# Patient Record
Sex: Female | Born: 1945
Health system: Southern US, Community
[De-identification: ages and names within clinical notes are randomized; demographics above are authoritative.]

## PROBLEM LIST (undated history)

## (undated) DIAGNOSIS — N201 Calculus of ureter: Secondary | ICD-10-CM

## (undated) DIAGNOSIS — N200 Calculus of kidney: Secondary | ICD-10-CM

## (undated) DIAGNOSIS — R350 Frequency of micturition: Secondary | ICD-10-CM

## (undated) DIAGNOSIS — Z87442 Personal history of urinary calculi: Secondary | ICD-10-CM

## (undated) DIAGNOSIS — D509 Iron deficiency anemia, unspecified: Secondary | ICD-10-CM

## (undated) DIAGNOSIS — M1990 Unspecified osteoarthritis, unspecified site: Secondary | ICD-10-CM

## (undated) DIAGNOSIS — F329 Major depressive disorder, single episode, unspecified: Secondary | ICD-10-CM

## (undated) DIAGNOSIS — K449 Diaphragmatic hernia without obstruction or gangrene: Secondary | ICD-10-CM

## (undated) DIAGNOSIS — E785 Hyperlipidemia, unspecified: Secondary | ICD-10-CM

## (undated) DIAGNOSIS — R3915 Urgency of urination: Secondary | ICD-10-CM

## (undated) DIAGNOSIS — L659 Nonscarring hair loss, unspecified: Secondary | ICD-10-CM

## (undated) DIAGNOSIS — R319 Hematuria, unspecified: Secondary | ICD-10-CM

## (undated) HISTORY — PX: CATARACT EXTRACTION W/ INTRAOCULAR LENS  IMPLANT, BILATERAL: SHX1307

## (undated) HISTORY — PX: INNER EAR SURGERY: SHX679

## (undated) HISTORY — PX: OTHER SURGICAL HISTORY: SHX169

---

## 1998-04-30 ENCOUNTER — Ambulatory Visit (HOSPITAL_COMMUNITY): Admission: RE | Admit: 1998-04-30 | Discharge: 1998-04-30 | Payer: Self-pay

## 1998-05-05 ENCOUNTER — Ambulatory Visit (HOSPITAL_COMMUNITY): Admission: RE | Admit: 1998-05-05 | Discharge: 1998-05-05 | Payer: Self-pay | Admitting: *Deleted

## 1998-11-21 ENCOUNTER — Other Ambulatory Visit: Admission: RE | Admit: 1998-11-21 | Discharge: 1998-11-21 | Payer: Self-pay | Admitting: *Deleted

## 1999-05-26 ENCOUNTER — Ambulatory Visit (HOSPITAL_COMMUNITY): Admission: RE | Admit: 1999-05-26 | Discharge: 1999-05-26 | Payer: Self-pay | Admitting: *Deleted

## 1999-06-12 ENCOUNTER — Emergency Department (HOSPITAL_COMMUNITY): Admission: EM | Admit: 1999-06-12 | Discharge: 1999-06-12 | Payer: Self-pay | Admitting: Emergency Medicine

## 1999-11-05 ENCOUNTER — Emergency Department (HOSPITAL_COMMUNITY): Admission: EM | Admit: 1999-11-05 | Discharge: 1999-11-05 | Payer: Self-pay | Admitting: Emergency Medicine

## 1999-12-11 ENCOUNTER — Other Ambulatory Visit: Admission: RE | Admit: 1999-12-11 | Discharge: 1999-12-11 | Payer: Self-pay | Admitting: *Deleted

## 2000-08-10 ENCOUNTER — Ambulatory Visit (HOSPITAL_COMMUNITY): Admission: RE | Admit: 2000-08-10 | Discharge: 2000-08-10 | Payer: Self-pay | Admitting: *Deleted

## 2000-12-19 ENCOUNTER — Other Ambulatory Visit: Admission: RE | Admit: 2000-12-19 | Discharge: 2000-12-19 | Payer: Self-pay | Admitting: *Deleted

## 2002-01-29 ENCOUNTER — Other Ambulatory Visit: Admission: RE | Admit: 2002-01-29 | Discharge: 2002-01-29 | Payer: Self-pay | Admitting: *Deleted

## 2003-03-14 ENCOUNTER — Other Ambulatory Visit: Admission: RE | Admit: 2003-03-14 | Discharge: 2003-03-14 | Payer: Self-pay | Admitting: *Deleted

## 2004-12-17 ENCOUNTER — Encounter: Admission: RE | Admit: 2004-12-17 | Discharge: 2004-12-17 | Payer: Self-pay | Admitting: Internal Medicine

## 2005-04-15 ENCOUNTER — Encounter: Admission: RE | Admit: 2005-04-15 | Discharge: 2005-07-14 | Payer: Self-pay | Admitting: Psychiatry

## 2006-08-25 ENCOUNTER — Encounter: Admission: RE | Admit: 2006-08-25 | Discharge: 2006-08-25 | Payer: Self-pay | Admitting: Family Medicine

## 2008-02-23 HISTORY — PX: WRIST SURGERY: SHX841

## 2009-07-09 ENCOUNTER — Other Ambulatory Visit: Admission: RE | Admit: 2009-07-09 | Discharge: 2009-07-09 | Payer: Self-pay | Admitting: Family Medicine

## 2010-02-22 HISTORY — PX: KNEE ARTHROSCOPY: SUR90

## 2010-03-15 ENCOUNTER — Encounter: Payer: Self-pay | Admitting: Physical Medicine and Rehabilitation

## 2010-04-30 ENCOUNTER — Emergency Department (HOSPITAL_COMMUNITY)
Admission: EM | Admit: 2010-04-30 | Discharge: 2010-04-30 | Discharge status: Against Medical Advice | Payer: Medicare Other | Attending: Emergency Medicine | Admitting: Emergency Medicine

## 2010-04-30 DIAGNOSIS — Z0389 Encounter for observation for other suspected diseases and conditions ruled out: Secondary | ICD-10-CM | POA: Insufficient documentation

## 2010-04-30 LAB — BASIC METABOLIC PANEL
CO2: 25 mEq/L (ref 19–32)
Calcium: 10.9 mg/dL — ABNORMAL HIGH (ref 8.4–10.5)
Chloride: 104 mEq/L (ref 96–112)
Creatinine, Ser: 0.87 mg/dL (ref 0.4–1.2)
GFR calc Af Amer: >60 mL/min (ref >60)
Sodium: 138 mEq/L (ref 135–145)

## 2010-04-30 LAB — URINALYSIS, ROUTINE W REFLEX MICROSCOPIC
Glucose, UA: NEGATIVE mg/dL
Leukocytes, UA: NEGATIVE
Protein, ur: NEGATIVE mg/dL
Specific Gravity, Urine: 1.017 (ref 1.005–1.030)
Urobilinogen, UA: 0.2 mg/dL (ref 0.0–1.0)

## 2010-04-30 LAB — DIFFERENTIAL
Basophils Absolute: 0 10*3/uL (ref 0.0–0.1)
Basophils Relative: 0 % (ref 0–1)
Eosinophils Absolute: 0.2 10*3/uL (ref 0.0–0.7)
Eosinophils Relative: 2 % (ref 0–5)
Lymphocytes Relative: 24 % (ref 12–46)
Lymphs Abs: 3.2 10*3/uL (ref 0.7–4.0)
Monocytes Absolute: 1 10*3/uL (ref 0.1–1.0)
Monocytes Relative: 7 % (ref 3–12)
Neutro Abs: 8.7 10*3/uL — ABNORMAL HIGH (ref 1.7–7.7)
Neutrophils Relative %: 66 % (ref 43–77)

## 2010-04-30 LAB — URINE MICROSCOPIC-ADD ON

## 2010-04-30 LAB — CBC
Hemoglobin: 12.3 g/dL (ref 12.0–15.0)
Platelets: 274 10*3/uL (ref 150–400)
RBC: 4.08 MIL/uL (ref 3.87–5.11)

## 2012-08-20 ENCOUNTER — Observation Stay (HOSPITAL_COMMUNITY): Payer: BC Managed Care – PPO | Admitting: *Deleted

## 2012-08-20 ENCOUNTER — Emergency Department (HOSPITAL_COMMUNITY): Payer: BC Managed Care – PPO

## 2012-08-20 ENCOUNTER — Encounter (HOSPITAL_COMMUNITY): Payer: Self-pay

## 2012-08-20 ENCOUNTER — Encounter (HOSPITAL_COMMUNITY): Payer: Self-pay | Admitting: *Deleted

## 2012-08-20 ENCOUNTER — Observation Stay (HOSPITAL_COMMUNITY)
Admission: EM | Admit: 2012-08-20 | Discharge: 2012-08-21 | Disposition: A | Discharge status: Home / Self Care | Payer: BC Managed Care – PPO | Attending: Urology | Admitting: Urology

## 2012-08-20 ENCOUNTER — Encounter (HOSPITAL_COMMUNITY)
Admission: EM | Disposition: A | Discharge status: Home / Self Care | Payer: Self-pay | Source: Home / Self Care | Attending: Emergency Medicine

## 2012-08-20 ENCOUNTER — Inpatient Hospital Stay: Admit: 2012-08-20 | Payer: Self-pay | Admitting: Urology

## 2012-08-20 DIAGNOSIS — N390 Urinary tract infection, site not specified: Secondary | ICD-10-CM

## 2012-08-20 DIAGNOSIS — E785 Hyperlipidemia, unspecified: Secondary | ICD-10-CM | POA: Insufficient documentation

## 2012-08-20 DIAGNOSIS — N201 Calculus of ureter: Principal | ICD-10-CM | POA: Insufficient documentation

## 2012-08-20 DIAGNOSIS — R82998 Other abnormal findings in urine: Secondary | ICD-10-CM | POA: Insufficient documentation

## 2012-08-20 DIAGNOSIS — N1330 Unspecified hydronephrosis: Secondary | ICD-10-CM | POA: Insufficient documentation

## 2012-08-20 DIAGNOSIS — Z79899 Other long term (current) drug therapy: Secondary | ICD-10-CM | POA: Insufficient documentation

## 2012-08-20 HISTORY — DX: Hyperlipidemia, unspecified: E78.5

## 2012-08-20 HISTORY — PX: CYSTOSCOPY WITH STENT PLACEMENT: SHX5790

## 2012-08-20 LAB — URINE MICROSCOPIC-ADD ON

## 2012-08-20 LAB — BASIC METABOLIC PANEL
CO2: 25 mEq/L (ref 19–32)
Calcium: 9.1 mg/dL (ref 8.4–10.5)
Chloride: 105 mEq/L (ref 96–112)
Creatinine, Ser: 0.83 mg/dL (ref 0.50–1.10)
Glucose, Bld: 121 mg/dL — ABNORMAL HIGH (ref 70–99)

## 2012-08-20 LAB — CBC
HCT: 32.6 % — ABNORMAL LOW (ref 36.0–46.0)
Hemoglobin: 10.5 g/dL — ABNORMAL LOW (ref 12.0–15.0)
MCH: 28.2 pg (ref 26.0–34.0)
MCV: 87.6 fL (ref 78.0–100.0)
Platelets: 257 10*3/uL (ref 150–400)
RBC: 3.72 MIL/uL — ABNORMAL LOW (ref 3.87–5.11)
WBC: 11.5 10*3/uL — ABNORMAL HIGH (ref 4.0–10.5)

## 2012-08-20 LAB — URINALYSIS, ROUTINE W REFLEX MICROSCOPIC
Bilirubin Urine: NEGATIVE
Glucose, UA: NEGATIVE mg/dL
Hgb urine dipstick: NEGATIVE
Specific Gravity, Urine: 1.019 (ref 1.005–1.030)
Urobilinogen, UA: 0.2 mg/dL (ref 0.0–1.0)

## 2012-08-20 SURGERY — CYSTOSCOPY, WITH STENT INSERTION
Anesthesia: General | Site: Ureter | Laterality: Right | Wound class: Clean Contaminated

## 2012-08-20 MED ORDER — ACETAMINOPHEN 325 MG PO TABS: 650.0000 mg | ORAL_TABLET | ORAL | Status: DC | PRN

## 2012-08-20 MED ORDER — FAMOTIDINE 20 MG PO TABS
20.0000 mg | ORAL_TABLET | Freq: Two times a day (BID) | ORAL | Status: DC | PRN
  Filled 2012-08-20: qty 1

## 2012-08-20 MED ORDER — PHENYLEPHRINE HCL 10 MG/ML IJ SOLN
INTRAMUSCULAR | Status: DC | PRN
  Administered 2012-08-20: 120 ug via INTRAVENOUS
  Administered 2012-08-20: 40 ug via INTRAVENOUS

## 2012-08-20 MED ORDER — METOCLOPRAMIDE HCL 5 MG/ML IJ SOLN
INTRAMUSCULAR | Status: DC | PRN
  Administered 2012-08-20: 10 mg via INTRAVENOUS

## 2012-08-20 MED ORDER — KETAMINE HCL 10 MG/ML IJ SOLN
INTRAMUSCULAR | Status: DC | PRN
  Administered 2012-08-20: 10 mg via INTRAVENOUS

## 2012-08-20 MED ORDER — PROPOFOL 10 MG/ML IV BOLUS
INTRAVENOUS | Status: DC | PRN
  Administered 2012-08-20: 80 mg via INTRAVENOUS

## 2012-08-20 MED ORDER — DEXTROSE 5 % IV SOLN
1.0000 g | Freq: Once | INTRAVENOUS | Status: AC
  Administered 2012-08-20: 1 g via INTRAVENOUS
  Filled 2012-08-20: qty 10

## 2012-08-20 MED ORDER — LACTATED RINGERS IV SOLN: INTRAVENOUS | Status: DC

## 2012-08-20 MED ORDER — OXYCODONE-ACETAMINOPHEN 5-325 MG PO TABS
1.0000 | ORAL_TABLET | Freq: Once | ORAL | Status: AC
  Administered 2012-08-20: 1 via ORAL
  Filled 2012-08-20: qty 1

## 2012-08-20 MED ORDER — SULFAMETHOXAZOLE-TRIMETHOPRIM 400-80 MG PO TABS: 1.0000 | ORAL_TABLET | Freq: Two times a day (BID) | ORAL | Status: DC

## 2012-08-20 MED ORDER — ENOXAPARIN SODIUM 40 MG/0.4ML ~~LOC~~ SOLN
40.0000 mg | SUBCUTANEOUS | Status: DC
  Administered 2012-08-20: 40 mg via SUBCUTANEOUS
  Filled 2012-08-20 (×2): qty 0.4

## 2012-08-20 MED ORDER — EPHEDRINE SULFATE 50 MG/ML IJ SOLN
INTRAMUSCULAR | Status: DC | PRN
  Administered 2012-08-20: 10 mg via INTRAVENOUS

## 2012-08-20 MED ORDER — ONDANSETRON HCL 4 MG/2ML IJ SOLN: 4.0000 mg | INTRAMUSCULAR | Status: DC | PRN

## 2012-08-20 MED ORDER — HYDROMORPHONE HCL PF 1 MG/ML IJ SOLN
0.5000 mg | INTRAMUSCULAR | Status: DC | PRN
  Administered 2012-08-20: 0.5 mg via INTRAVENOUS
  Filled 2012-08-20: qty 1

## 2012-08-20 MED ORDER — SODIUM CHLORIDE 0.9 % IR SOLN
Status: DC | PRN
  Administered 2012-08-20: 3000 mL

## 2012-08-20 MED ORDER — MIDAZOLAM HCL 5 MG/5ML IJ SOLN
INTRAMUSCULAR | Status: DC | PRN
  Administered 2012-08-20: 2 mg via INTRAVENOUS

## 2012-08-20 MED ORDER — OXYBUTYNIN CHLORIDE 5 MG PO TABS: 5.0000 mg | ORAL_TABLET | Freq: Three times a day (TID) | ORAL | Status: DC

## 2012-08-20 MED ORDER — HYDROMORPHONE HCL PF 1 MG/ML IJ SOLN: 0.2500 mg | INTRAMUSCULAR | Status: DC | PRN

## 2012-08-20 MED ORDER — DEXAMETHASONE SODIUM PHOSPHATE 4 MG/ML IJ SOLN
INTRAMUSCULAR | Status: DC | PRN
  Administered 2012-08-20: 10 mg via INTRAVENOUS

## 2012-08-20 MED ORDER — PROMETHAZINE HCL 25 MG/ML IJ SOLN
6.2500 mg | INTRAMUSCULAR | Status: DC | PRN
  Filled 2012-08-20: qty 1

## 2012-08-20 MED ORDER — MORPHINE SULFATE 4 MG/ML IJ SOLN
6.0000 mg | Freq: Once | INTRAMUSCULAR | Status: AC
  Administered 2012-08-20: 5 mg via INTRAVENOUS
  Filled 2012-08-20: qty 2

## 2012-08-20 MED ORDER — HYDROCODONE-ACETAMINOPHEN 5-325 MG PO TABS
1.0000 | ORAL_TABLET | ORAL | Status: DC | PRN
  Administered 2012-08-20: 2 via ORAL
  Filled 2012-08-20: qty 2

## 2012-08-20 MED ORDER — SULFAMETHOXAZOLE-TMP DS 800-160 MG PO TABS
1.0000 | ORAL_TABLET | Freq: Two times a day (BID) | ORAL | Status: DC
  Administered 2012-08-20 (×2): 1 via ORAL
  Filled 2012-08-20 (×4): qty 1

## 2012-08-20 MED ORDER — LACTATED RINGERS IV SOLN
INTRAVENOUS | Status: DC
  Administered 2012-08-20: 100 mL/h via INTRAVENOUS

## 2012-08-20 MED ORDER — FENTANYL CITRATE 0.05 MG/ML IJ SOLN
INTRAMUSCULAR | Status: DC | PRN
  Administered 2012-08-20: 50 ug via INTRAVENOUS

## 2012-08-20 MED ORDER — ZOLPIDEM TARTRATE 5 MG PO TABS
5.0000 mg | ORAL_TABLET | Freq: Every evening | ORAL | Status: DC | PRN
  Administered 2012-08-20: 5 mg via ORAL
  Filled 2012-08-20: qty 1

## 2012-08-20 MED ORDER — SODIUM CHLORIDE 0.45 % IV SOLN
INTRAVENOUS | Status: DC
  Administered 2012-08-20 – 2012-08-21 (×2): via INTRAVENOUS

## 2012-08-20 MED ORDER — HYDROCODONE-ACETAMINOPHEN 5-325 MG PO TABS: 1.0000 | ORAL_TABLET | ORAL | Status: DC | PRN

## 2012-08-20 MED ORDER — ONDANSETRON HCL 4 MG/2ML IJ SOLN
4.0000 mg | Freq: Once | INTRAMUSCULAR | Status: AC
  Administered 2012-08-20: 4 mg via INTRAVENOUS
  Filled 2012-08-20: qty 2

## 2012-08-20 MED ORDER — ONDANSETRON HCL 4 MG/2ML IJ SOLN
INTRAMUSCULAR | Status: DC | PRN
  Administered 2012-08-20: 4 mg via INTRAVENOUS

## 2012-08-20 MED ORDER — DOCUSATE SODIUM 100 MG PO CAPS
100.0000 mg | ORAL_CAPSULE | Freq: Two times a day (BID) | ORAL | Status: DC
  Administered 2012-08-20 (×2): 100 mg via ORAL
  Filled 2012-08-20 (×4): qty 1

## 2012-08-20 MED ORDER — EZETIMIBE 10 MG PO TABS
10.0000 mg | ORAL_TABLET | Freq: Every day | ORAL | Status: DC
  Administered 2012-08-20: 10 mg via ORAL
  Filled 2012-08-20 (×2): qty 1

## 2012-08-20 MED ORDER — SULFAMETHOXAZOLE-TMP DS 800-160 MG PO TABS: 1.0000 | ORAL_TABLET | Freq: Two times a day (BID) | ORAL | Status: DC

## 2012-08-20 MED ORDER — SERTRALINE HCL 100 MG PO TABS
100.0000 mg | ORAL_TABLET | Freq: Every day | ORAL | Status: DC
  Administered 2012-08-20: 100 mg via ORAL
  Filled 2012-08-20 (×2): qty 1

## 2012-08-20 MED ORDER — KETOROLAC TROMETHAMINE 30 MG/ML IJ SOLN
30.0000 mg | Freq: Once | INTRAMUSCULAR | Status: AC
  Administered 2012-08-20: 30 mg via INTRAVENOUS
  Filled 2012-08-20: qty 1

## 2012-08-20 SURGICAL SUPPLY — 16 items
ADAPTER CATH URET PLST 4-6FR (CATHETERS) ×2 IMPLANT
ADPR CATH URET STRL DISP 4-6FR (CATHETERS) ×1
BAG URO CATCHER STRL LF (DRAPE) ×2 IMPLANT
BASKET ZERO TIP NITINOL 2.4FR (BASKET) IMPLANT
BSKT STON RTRVL ZERO TP 2.4FR (BASKET)
CATH INTERMIT  6FR 70CM (CATHETERS) IMPLANT
CLOTH BEACON ORANGE TIMEOUT ST (SAFETY) ×2 IMPLANT
DRAPE CAMERA CLOSED 9X96 (DRAPES) ×2 IMPLANT
GLOVE BIOGEL M 8.0 STRL (GLOVE) ×1 IMPLANT
GOWN PREVENTION PLUS XLARGE (GOWN DISPOSABLE) ×2 IMPLANT
GUIDEWIRE ANG ZIPWIRE 038X150 (WIRE) IMPLANT
GUIDEWIRE STR DUAL SENSOR (WIRE) ×2 IMPLANT
MANIFOLD NEPTUNE II (INSTRUMENTS) ×2 IMPLANT
PACK CYSTO (CUSTOM PROCEDURE TRAY) ×2 IMPLANT
STENT CONTOUR 6FRX24X.038 (STENTS) ×1 IMPLANT
TUBING CONNECTING 10 (TUBING) ×2 IMPLANT

## 2012-08-20 NOTE — ED Notes (Signed)
Pt states she has been taking naprosyn for dental pain.  States she has had upset stomach and back pain starting tonight.  Remembers having this happen before.

## 2012-08-20 NOTE — Op Note (Signed)
Preoperative diagnosis: Right ureteral stone with pyuria  Postoperative diagnosis: Same   Procedure: Cystoscopy, right double-J stent placement-6 French by 24 cm contour without string    Surgeon: Bertram Millard. Amairani Shuey, M.D.   Anesthesia: Gen.   Complications: None  Specimen(s): None  Drain(s): Above-mentioned stent  Indications: 67 year-old female with recent presentation of obstructing right proximal ureteral/UVJ stone and pyuria as well as chills. She presents at this time for urgent decompression of her right renal unit with cystoscopy and stent placement. Risks and location to the procedure have been discussed with the patient. She understands these and desires to proceed.    Technique and findings: The patient had been previously marked on the right side, she was identified in the holding area. She had already received IV Rocephin. She was taken to the operating room where general anesthetic was administered. She was placed in the dorsolithotomy position. Genitalia and perineum were prepped and draped. Upper timeout was then performed.  I passed a 22 [place] cystoscope. Bladder was inspected circumferentially. There were no tumors, trabeculations or foreign bodies. Ureteral orifices were normal in configuration and location bilaterally. The right ureteral orifice was cannulated with a 0.038 inch sensor-tip guidewire which was eventually negotiated past the right proximal ureteral stone with minimal difficulty. Once a curl of the guidewire was seen in the right kidney, I then advanced a 6 [place] by 24 cm contour double-J stent over the guidewire, positioning it properly with the pusher using fluoroscopic and cystoscopic guidance. Following adequate positioning, the guidewire was removed, good curls were seen proximally and distally. At this point the bladder was drained and the procedure terminated. The patient tolerated the procedure well. She was awakened and taken to the PACU in stable  condition.

## 2012-08-20 NOTE — H&P (Signed)
H&P  Chief Complaint: Kidney stone   History of Present Illness: Glenda Klein is a 67 y.o. year old female is admitted for urgent management of a right proximal ureteral stone, 4 x 8 mm in size. She began having flank pain a couple of days ago. This was associated with nausea and vomiting. She has also had some chills. She has no long-standing history of recurrent urinary tract infections and has not had fever. He presented to the emergency room with her above complaints, and CT scan was performed revealing the right UPJ stone. Additionally, her urine appeared infected. Because of the combination of a proximal ureteral stone and possible infection, she is admitted for cystoscopy and stent placement to decompress her right renal unit as well as antibiotic management.  The patient has high cholesterol treated with a medication, and is also on Zoloft. She has had knee and wrist surgery. She denies any other significant medical issues.  Past Medical History  Diagnosis Date  . Depression   . Hyperlipidemia     Past Surgical History  Procedure Laterality Date  . Inner ear surgery    . Wrist surgery    . Cervix surgery    . Knee surgery      Home Medications:   (Not in a hospital admission)  Allergies:  Allergies  Allergen Reactions  . Statins Other (See Comments)    Arm numbness     History reviewed. No pertinent family history.  Social History:  reports that she has never smoked. She does not have any smokeless tobacco history on file. She reports that  drinks alcohol. She reports that she does not use illicit drugs.  ROS: A complete review of systems was performed.  All systems are negative except for pertinent findings as noted.  Physical Exam:  Vital signs in last 24 hours: Temp:  [97.4 F (36.3 C)] 97.4 F (36.3 C) (06/29 0432) Pulse Rate:  [61-77] 77 (06/29 0727) Resp:  [16-18] 16 (06/29 0727) BP: (104-140)/(60-76) 104/60 mmHg (06/29 0727) SpO2:  [93 %-98 %] 93 %  (06/29 0727) Weight:  [143 lb (64.864 kg)] 143 lb (64.864 kg) (06/29 0432) General:  Alert and oriented, No acute distress HEENT: Normocephalic, atraumatic Neck: No JVD or lymphadenopathy Cardiovascular: Regular rate and rhythm Lungs: Clear bilaterally Abdomen: Soft, with right CVA and lower quadrant tenderness, nondistended, no abdominal masses. No rebound or guarding Back: Mild to moderate right CVA tenderness Extremities: No edema Neurologic: Grossly intact  Laboratory Data:  Results for orders placed during the hospital encounter of 08/20/12 (from the past 24 hour(s))  URINALYSIS, ROUTINE W REFLEX MICROSCOPIC     Status: Abnormal   Collection Time    08/20/12  5:36 AM      Result Value Range   Color, Urine YELLOW  YELLOW   APPearance CLOUDY (*) CLEAR   Specific Gravity, Urine 1.019  1.005 - 1.030   pH 7.5  5.0 - 8.0   Glucose, UA NEGATIVE  NEGATIVE mg/dL   Hgb urine dipstick NEGATIVE  NEGATIVE   Bilirubin Urine NEGATIVE  NEGATIVE   Ketones, ur NEGATIVE  NEGATIVE mg/dL   Protein, ur NEGATIVE  NEGATIVE mg/dL   Urobilinogen, UA 0.2  0.0 - 1.0 mg/dL   Nitrite NEGATIVE  NEGATIVE   Leukocytes, UA MODERATE (*) NEGATIVE  URINE MICROSCOPIC-ADD ON     Status: Abnormal   Collection Time    08/20/12  5:36 AM      Result Value Range   Squamous Epithelial /  LPF RARE  RARE   WBC, UA 11-20  <3 WBC/hpf   RBC / HPF 0-2  <3 RBC/hpf   Bacteria, UA MANY (*) RARE   Urine-Other MUCOUS PRESENT    CBC     Status: Abnormal   Collection Time    08/20/12  6:35 AM      Result Value Range   WBC 11.5 (*) 4.0 - 10.5 K/uL   RBC 3.72 (*) 3.87 - 5.11 MIL/uL   Hemoglobin 10.5 (*) 12.0 - 15.0 g/dL   HCT 40.1 (*) 02.7 - 25.3 %   MCV 87.6  78.0 - 100.0 fL   MCH 28.2  26.0 - 34.0 pg   MCHC 32.2  30.0 - 36.0 g/dL   RDW 66.4  40.3 - 47.4 %   Platelets 257  150 - 400 K/uL  BASIC METABOLIC PANEL     Status: Abnormal   Collection Time    08/20/12  6:35 AM      Result Value Range   Sodium 139  135 -  145 mEq/L   Potassium 3.6  3.5 - 5.1 mEq/L   Chloride 105  96 - 112 mEq/L   CO2 25  19 - 32 mEq/L   Glucose, Bld 121 (*) 70 - 99 mg/dL   BUN 17  6 - 23 mg/dL   Creatinine, Ser 2.59  0.50 - 1.10 mg/dL   Calcium 9.1  8.4 - 56.3 mg/dL   GFR calc non Af Amer 71 (*) >90 mL/min   GFR calc Af Amer 83 (*) >90 mL/min   No results found for this or any previous visit (from the past 240 hour(s)). Creatinine:  Recent Labs  08/20/12 0635  CREATININE 0.83    Radiologic Imaging: Ct Abdomen Pelvis Wo Contrast  08/20/2012   *RADIOLOGY REPORT*  Clinical Data:  Stomach and back pain.  CT ABDOMEN AND PELVIS WITHOUT CONTRAST (CT UROGRAM)  Technique: Contiguous axial images of the abdomen and pelvis without oral or intravenous contrast were obtained.  Comparison: None  Findings:  Exam is limited for evaluation of entities other than urinary tract calculi due to lack of oral or intravenous contrast.   Lung bases:  Clear lung bases.  Normal heart size without pericardial or pleural effusion.  A moderate hiatal hernia with fluid level in the herniated stomach.  Abdomen/pelvis:  Right hepatic lobe 13 mm cyst.  Sub centimeter left hepatic lobe cyst.  Splenule.  Normal pancreas, gallbladder, biliary tract, adrenal glands.  Multiple central renal calcifications,.  Interpolar right renal lesion which is likely a cyst of 1.9 cm.  Moderate right-sided urinary tract obstruction to the level of a proximal ureteric 8 x 4 mm stone.  No retroperitoneal or retrocrural adenopathy.  Normal colon and terminal ileum.  Normal small bowel without abdominal ascites.  Phleboliths in the pelvis, but no convincing evidence of ureteric stone. No pelvic adenopathy.  Normal urinary bladder and uterus, without adnexal mass or significant free pelvic fluid.  Bones/Musculoskeletal:  No acute osseous abnormality.  IMPRESSION:  1.  Moderate right-sided urinary tract obstruction secondary to a 4 x 8 mm proximal right ureteric stone. 2.  Multiple  central renal calcifications.  Primarily felt to represent collecting system calculi.  A component of medullary nephrocalcinosis cannot be excluded. 3.  Moderate hiatal hernia.   Original Report Authenticated By: Jeronimo Greaves, M.D.    Impression/Assessment:  1. Proximal right ureteral stone, 4 x 8 mm with associated hydronephrosis  2. Possible UTI  Plan:  1.  I will admit the patient for cystoscopy and stent placement as well as pain management. She has recently had about 4 ounces of water, so we can proceed per anesthesia.  2. I discussed the procedure with the patient, including risks and complications. If she does well following the procedure him a him is afebrile, I can possibly discharge her tomorrow with eventual followup for lithotripsy.  Chelsea Aus 08/20/2012, 7:57 AM  Bertram Millard. Adryan Shin MD

## 2012-08-20 NOTE — ED Provider Notes (Signed)
History    CSN: [id number] Arrival date & time 08/20/12  9562  First MD Initiated Contact with Patient 08/20/12 0435     Chief Complaint  Patient presents with  . Flank Pain  . Abdominal Pain    HPI Patient presents with severe right flank pain that began last night.  She reports suprapubic discomfort as well.  She denies dysuria urinary frequency.  No prior history kidney stones. She denies fevers and chills.  The patient's had nausea and vomiting tonight.  She's never had pain like this before.  She feels her pain is like birthing a child.  Nothing improves or worsens her pain. Her pain is severe.     Past Medical History  Diagnosis Date  . Depression   . Hyperlipidemia    Past Surgical History  Procedure Laterality Date  . Inner ear surgery    . Wrist surgery    . Cervix surgery    . Knee surgery     History reviewed. No pertinent family history. History  Substance Use Topics  . Smoking status: Never Smoker   . Smokeless tobacco: Not on file  . Alcohol Use: Yes     Comment: rarely   OB History   Grav Para Term Preterm Abortions TAB SAB Ect Mult Living                 Review of Systems  All other systems reviewed and are negative.    Allergies  Statins  Home Medications   Current Outpatient Rx  Name  Route  Sig  Dispense  Refill  . ezetimibe (ZETIA) 10 MG tablet   Oral   Take 10 mg by mouth daily.         . famotidine (PEPCID) 20 MG tablet   Oral   Take 20 mg by mouth 2 (two) times daily as needed for heartburn.         . naproxen (NAPROSYN) 500 MG tablet   Oral   Take 500 mg by mouth 2 (two) times daily with a meal.         . sertraline (ZOLOFT) 100 MG tablet   Oral   Take 100 mg by mouth daily.          BP 140/76  Pulse 61  Temp(Src) 97.4 F (36.3 C) (Oral)  Resp 18  Wt 143 lb (64.864 kg)  SpO2 98% Physical Exam  Nursing note and vitals reviewed. Constitutional: She is oriented to person, place, and time. She appears  well-developed and well-nourished. No distress.  HENT:  Head: Normocephalic and atraumatic.  Eyes: EOM are normal.  Neck: Normal range of motion.  Cardiovascular: Normal rate, regular rhythm and normal heart sounds.   Pulmonary/Chest: Effort normal and breath sounds normal.  Abdominal: Soft. She exhibits no distension. There is no tenderness.  Musculoskeletal: Normal range of motion.  Neurological: She is alert and oriented to person, place, and time.  Skin: Skin is warm and dry.  Psychiatric: She has a normal mood and affect. Judgment normal.    ED Course  Procedures (including critical care time) Labs Reviewed  URINALYSIS, ROUTINE W REFLEX MICROSCOPIC - Abnormal; Notable for the following:    APPearance CLOUDY (*)    Leukocytes, UA MODERATE (*)    All other components within normal limits  URINE MICROSCOPIC-ADD ON - Abnormal; Notable for the following:    Bacteria, UA MANY (*)    All other components within normal limits  CBC - Abnormal; Notable  for the following:    WBC 11.5 (*)    RBC 3.72 (*)    Hemoglobin 10.5 (*)    HCT 32.6 (*)    All other components within normal limits  URINE CULTURE  BASIC METABOLIC PANEL   Ct Abdomen Pelvis Wo Contrast  08/20/2012   *RADIOLOGY REPORT*  Clinical Data:  Stomach and back pain.  CT ABDOMEN AND PELVIS WITHOUT CONTRAST (CT UROGRAM)  Technique: Contiguous axial images of the abdomen and pelvis without oral or intravenous contrast were obtained.  Comparison: None  Findings:  Exam is limited for evaluation of entities other than urinary tract calculi due to lack of oral or intravenous contrast.   Lung bases:  Clear lung bases.  Normal heart size without pericardial or pleural effusion.  A moderate hiatal hernia with fluid level in the herniated stomach.  Abdomen/pelvis:  Right hepatic lobe 13 mm cyst.  Sub centimeter left hepatic lobe cyst.  Splenule.  Normal pancreas, gallbladder, biliary tract, adrenal glands.  Multiple central renal  calcifications,.  Interpolar right renal lesion which is likely a cyst of 1.9 cm.  Moderate right-sided urinary tract obstruction to the level of a proximal ureteric 8 x 4 mm stone.  No retroperitoneal or retrocrural adenopathy.  Normal colon and terminal ileum.  Normal small bowel without abdominal ascites.  Phleboliths in the pelvis, but no convincing evidence of ureteric stone. No pelvic adenopathy.  Normal urinary bladder and uterus, without adnexal mass or significant free pelvic fluid.  Bones/Musculoskeletal:  No acute osseous abnormality.  IMPRESSION:  1.  Moderate right-sided urinary tract obstruction secondary to a 4 x 8 mm proximal right ureteric stone. 2.  Multiple central renal calcifications.  Primarily felt to represent collecting system calculi.  A component of medullary nephrocalcinosis cannot be excluded. 3.  Moderate hiatal hernia.   Original Report Authenticated By: Jeronimo Greaves, M.D.   I personally reviewed the imaging tests through PACS system I reviewed available ER/hospitalization records through the EMR   No diagnosis found.  MDM  Patient with large right-sided proximal ureteral stone with evidence of hydronephrosis.  Her pain is much better after pain medicine the emergency department however she appears that she may be developing an infected stone with 11-20 white blood cells and many bacteria.  IV Rocephin the emergency department.  Urine culture.  I discussed the case with urology who will by with patient the bedside.  Urology: Dr. Ellie Lunch, MD 08/20/12 [phone]

## 2012-08-20 NOTE — Preoperative (Signed)
Beta Blockers   Reason not to administer Beta Blockers:Not Applicable, not on home BB 

## 2012-08-20 NOTE — Anesthesia Preprocedure Evaluation (Signed)
Anesthesia Evaluation  Patient identified by MRN, date of birth, ID band Patient awake    Reviewed: Allergy & Precautions, H&P , NPO status , Patient's Chart, lab work & pertinent test results  Airway Mallampati: II TM Distance: >3 FB Neck ROM: Full    Dental  (+) Teeth Intact, Caps and Dental Advisory Given,    Pulmonary neg pulmonary ROS,  breath sounds clear to auscultation  Pulmonary exam normal       Cardiovascular negative cardio ROS  Rhythm:Regular Rate:Normal     Neuro/Psych Depression negative neurological ROS     GI/Hepatic negative GI ROS, Neg liver ROS,   Endo/Other  negative endocrine ROS  Renal/GU negative Renal ROS  negative genitourinary   Musculoskeletal negative musculoskeletal ROS (+)   Abdominal   Peds negative pediatric ROS (+)  Hematology negative hematology ROS (+)   Anesthesia Other Findings   Reproductive/Obstetrics negative OB ROS                           Anesthesia Physical Anesthesia Plan  ASA: II and emergent  Anesthesia Plan: General   Post-op Pain Management:    Induction: Intravenous  Airway Management Planned: LMA  Additional Equipment:   Intra-op Plan:   Post-operative Plan: Extubation in OR  Informed Consent: I have reviewed the patients History and Physical, chart, labs and discussed the procedure including the risks, benefits and alternatives for the proposed anesthesia with the patient or authorized representative who has indicated his/her understanding and acceptance.   Dental advisory given  Plan Discussed with: CRNA  Anesthesia Plan Comments: (Risk of dental injury explained and all questions answered.)        Anesthesia Quick Evaluation

## 2012-08-20 NOTE — Transfer of Care (Signed)
Immediate Anesthesia Transfer of Care Note  Patient: Glenda Klein  Procedure(s) Performed: Procedure(s): CYSTOSCOPY WITH STENT PLACEMENT (Right)  Patient Location: PACU  Anesthesia Type:General  Level of Consciousness: awake, patient cooperative and responds to stimulation  Airway & Oxygen Therapy: Patient Spontanous Breathing and Patient connected to face mask oxygen  Post-op Assessment: Report given to PACU RN, Post -op Vital signs reviewed and stable and Patient moving all extremities X 4  Post vital signs: Reviewed and stable  Complications: No apparent anesthesia complications

## 2012-08-21 ENCOUNTER — Encounter (HOSPITAL_COMMUNITY): Payer: Self-pay | Admitting: Urology

## 2012-08-21 LAB — URINE CULTURE: Colony Count: NO GROWTH

## 2012-08-21 NOTE — Discharge Summary (Signed)
Physician Discharge Summary  Patient ID: Glenda Klein MRN: [id number] DOB/AGE: 09-07-1945 67 y.o.  Admit date: 08/20/2012 Discharge date: 08/21/2012  Admission Diagnoses: Rt Ureteral Stone  Discharge Diagnoses:  Rt Ureteral Stone   Discharged Condition: good  Hospital Course: Pt admitted through ER 6/29 with Rt ureteral stone and refractory pain and nausea with emesis. Also with some subjective chills and pyuria. Underwent urgent right ureteral stenting on 6/29 and admitted overnight for observation. By 6/30, the day of discharge, the patients' pain was now controlled, nausea had subsided, she remained afebrile and felt to be satisfactory for discharge. UCX 6/29 pending.  Consults: None  Significant Diagnostic Studies: radiology: CT scan: CT Stone with Rt ureteral stone   Treatments: surgery: right ureteral stenting on 6/29  Discharge Exam: Blood pressure 104/68, pulse 67, temperature 98.4 F (36.9 C), temperature source Oral, resp. rate 22, height 5\' 5"  (1.651 m), weight 64.864 kg (143 lb), SpO2 92.00%. General appearance: alert CV - RR PULM - CTAB GI - SNTND GU - No CVAT MSK - normal ROM, no c/c/e NEURO - AOx3, non-focal  Disposition: Home    Medication List         ezetimibe 10 MG tablet  Commonly known as:  ZETIA  Take 10 mg by mouth daily.     famotidine 20 MG tablet  Commonly known as:  PEPCID  Take 20 mg by mouth 2 (two) times daily as needed for heartburn.     HYDROcodone-acetaminophen 5-325 MG per tablet  Commonly known as:  NORCO  Take 1-2 tablets by mouth every 4 (four) hours as needed for pain.     HYDROcodone-acetaminophen 5-325 MG per tablet  Commonly known as:  NORCO/VICODIN  Take 1-2 tablets by mouth every 4 (four) hours as needed.     naproxen 500 MG tablet  Commonly known as:  NAPROSYN  Take 500 mg by mouth 2 (two) times daily with a meal.     oxybutynin 5 MG tablet  Commonly known as:  DITROPAN  Take 1 tablet (5 mg total) by mouth 3  (three) times daily.     sertraline 100 MG tablet  Commonly known as:  ZOLOFT  Take 100 mg by mouth daily.     sulfamethoxazole-trimethoprim 400-80 MG per tablet  Commonly known as:  BACTRIM  Take 1 tablet by mouth 2 (two) times daily.     sulfamethoxazole-trimethoprim 800-160 MG per tablet  Commonly known as:  BACTRIM DS  Take 1 tablet by mouth every 12 (twelve) hours.           Follow-up Information   Follow up with Chelsea Aus, MD. (Call [phone] to set up an appointment with me)    Contact information:   [address] [place] [address] [phone]       Signed: Sebastian Ache 08/21/2012, 7:16 AM

## 2012-08-23 NOTE — Anesthesia Postprocedure Evaluation (Signed)
Anesthesia Post Note  Patient: Glenda Klein  Procedure(s) Performed: Procedure(s) (LRB): CYSTOSCOPY WITH STENT PLACEMENT (Right)  Anesthesia type: General  Patient location: PACU  Post pain: Pain level controlled  Post assessment: Post-op Vital signs reviewed  Last Vitals:  Filed Vitals:   08/21/12 0515  BP: 104/68  Pulse: 67  Temp: 36.9 C  Resp: 22    Post vital signs: Reviewed  Level of consciousness: sedated  Complications: No apparent anesthesia complications

## 2012-08-30 ENCOUNTER — Other Ambulatory Visit: Payer: Self-pay | Admitting: Urology

## 2012-09-13 ENCOUNTER — Encounter (HOSPITAL_BASED_OUTPATIENT_CLINIC_OR_DEPARTMENT_OTHER): Payer: Self-pay | Admitting: *Deleted

## 2012-09-14 ENCOUNTER — Encounter (HOSPITAL_BASED_OUTPATIENT_CLINIC_OR_DEPARTMENT_OTHER): Payer: Self-pay | Admitting: *Deleted

## 2012-09-14 NOTE — Progress Notes (Signed)
NPO AFTER MN. ARRIVES AT 1100. NEEDS HG. MAY TAKE OXYCODONE IF NEEDED W/ SIPS OF WATER AM OF SURG.

## 2012-09-21 ENCOUNTER — Ambulatory Visit (HOSPITAL_COMMUNITY): Payer: BC Managed Care – PPO

## 2012-09-21 ENCOUNTER — Ambulatory Visit (HOSPITAL_BASED_OUTPATIENT_CLINIC_OR_DEPARTMENT_OTHER): Payer: BC Managed Care – PPO | Admitting: Anesthesiology

## 2012-09-21 ENCOUNTER — Encounter (HOSPITAL_BASED_OUTPATIENT_CLINIC_OR_DEPARTMENT_OTHER)
Admission: RE | Disposition: A | Discharge status: Home / Self Care | Payer: Self-pay | Source: Ambulatory Visit | Attending: Urology

## 2012-09-21 ENCOUNTER — Encounter (HOSPITAL_BASED_OUTPATIENT_CLINIC_OR_DEPARTMENT_OTHER): Payer: Self-pay

## 2012-09-21 ENCOUNTER — Encounter (HOSPITAL_BASED_OUTPATIENT_CLINIC_OR_DEPARTMENT_OTHER): Payer: Self-pay | Admitting: Anesthesiology

## 2012-09-21 ENCOUNTER — Ambulatory Visit (HOSPITAL_BASED_OUTPATIENT_CLINIC_OR_DEPARTMENT_OTHER)
Admission: RE | Admit: 2012-09-21 | Discharge: 2012-09-21 | Disposition: A | Discharge status: Home / Self Care | Payer: BC Managed Care – PPO | Source: Ambulatory Visit | Attending: Urology | Admitting: Urology

## 2012-09-21 DIAGNOSIS — Z87440 Personal history of urinary (tract) infections: Secondary | ICD-10-CM | POA: Insufficient documentation

## 2012-09-21 DIAGNOSIS — E785 Hyperlipidemia, unspecified: Secondary | ICD-10-CM | POA: Insufficient documentation

## 2012-09-21 DIAGNOSIS — N201 Calculus of ureter: Secondary | ICD-10-CM | POA: Insufficient documentation

## 2012-09-21 DIAGNOSIS — M129 Arthropathy, unspecified: Secondary | ICD-10-CM | POA: Insufficient documentation

## 2012-09-21 DIAGNOSIS — Z888 Allergy status to other drugs, medicaments and biological substances status: Secondary | ICD-10-CM | POA: Insufficient documentation

## 2012-09-21 DIAGNOSIS — F3289 Other specified depressive episodes: Secondary | ICD-10-CM | POA: Insufficient documentation

## 2012-09-21 DIAGNOSIS — Z87891 Personal history of nicotine dependence: Secondary | ICD-10-CM | POA: Insufficient documentation

## 2012-09-21 DIAGNOSIS — F329 Major depressive disorder, single episode, unspecified: Secondary | ICD-10-CM | POA: Insufficient documentation

## 2012-09-21 DIAGNOSIS — K219 Gastro-esophageal reflux disease without esophagitis: Secondary | ICD-10-CM | POA: Insufficient documentation

## 2012-09-21 HISTORY — DX: Calculus of kidney: N20.0

## 2012-09-21 HISTORY — DX: Frequency of micturition: R35.0

## 2012-09-21 HISTORY — DX: Hematuria, unspecified: R31.9

## 2012-09-21 HISTORY — DX: Unspecified osteoarthritis, unspecified site: M19.90

## 2012-09-21 HISTORY — DX: Urgency of urination: R39.15

## 2012-09-21 HISTORY — PX: CYSTOSCOPY WITH URETEROSCOPY, STONE BASKETRY AND STENT PLACEMENT: SHX6378

## 2012-09-21 LAB — GLUCOSE, CAPILLARY: Glucose-Capillary: 82 mg/dL (ref 70–99)

## 2012-09-21 SURGERY — CYSTOSCOPY, WITH CALCULUS MANIPULATION OR REMOVAL
Anesthesia: General | Site: Ureter | Laterality: Right | Wound class: Clean Contaminated

## 2012-09-21 MED ORDER — ONDANSETRON HCL 4 MG/2ML IJ SOLN
INTRAMUSCULAR | Status: DC | PRN
  Administered 2012-09-21: 4 mg via INTRAVENOUS

## 2012-09-21 MED ORDER — SODIUM CHLORIDE 0.9 % IJ SOLN
3.0000 mL | INTRAMUSCULAR | Status: DC | PRN
  Filled 2012-09-21: qty 3

## 2012-09-21 MED ORDER — ACETAMINOPHEN 325 MG PO TABS
650.0000 mg | ORAL_TABLET | ORAL | Status: DC | PRN
  Filled 2012-09-21: qty 2

## 2012-09-21 MED ORDER — DEXAMETHASONE SODIUM PHOSPHATE 4 MG/ML IJ SOLN
INTRAMUSCULAR | Status: DC | PRN
  Administered 2012-09-21: 10 mg via INTRAVENOUS

## 2012-09-21 MED ORDER — PROPOFOL 10 MG/ML IV BOLUS
INTRAVENOUS | Status: DC | PRN
  Administered 2012-09-21: 200 mg via INTRAVENOUS
  Administered 2012-09-21: 50 mg via INTRAVENOUS

## 2012-09-21 MED ORDER — PROMETHAZINE HCL 25 MG/ML IJ SOLN
12.5000 mg | INTRAMUSCULAR | Status: DC | PRN
  Administered 2012-09-21 (×2): 6.25 mg via INTRAVENOUS
  Filled 2012-09-21: qty 1

## 2012-09-21 MED ORDER — CIPROFLOXACIN IN D5W 400 MG/200ML IV SOLN
400.0000 mg | INTRAVENOUS | Status: AC
  Administered 2012-09-21: 400 mg via INTRAVENOUS
  Filled 2012-09-21: qty 200

## 2012-09-21 MED ORDER — SODIUM CHLORIDE 0.9 % IJ SOLN
3.0000 mL | Freq: Two times a day (BID) | INTRAMUSCULAR | Status: DC
  Filled 2012-09-21: qty 3

## 2012-09-21 MED ORDER — SODIUM CHLORIDE 0.9 % IV SOLN
250.0000 mL | INTRAVENOUS | Status: DC | PRN
  Filled 2012-09-21: qty 250

## 2012-09-21 MED ORDER — ACETAMINOPHEN 650 MG RE SUPP
650.0000 mg | RECTAL | Status: DC | PRN
  Filled 2012-09-21: qty 1

## 2012-09-21 MED ORDER — SODIUM CHLORIDE 0.9 % IR SOLN
Status: DC | PRN
  Administered 2012-09-21: 6000 mL

## 2012-09-21 MED ORDER — KETOROLAC TROMETHAMINE 15 MG/ML IJ SOLN
15.0000 mg | Freq: Four times a day (QID) | INTRAMUSCULAR | Status: DC
  Filled 2012-09-21: qty 1

## 2012-09-21 MED ORDER — OXYCODONE HCL 5 MG PO TABS
5.0000 mg | ORAL_TABLET | ORAL | Status: DC | PRN
  Administered 2012-09-21: 5 mg via ORAL
  Filled 2012-09-21: qty 2

## 2012-09-21 MED ORDER — STERILE WATER FOR IRRIGATION IR SOLN
Status: DC | PRN
  Administered 2012-09-21: 1000 mL

## 2012-09-21 MED ORDER — KETOROLAC TROMETHAMINE 30 MG/ML IJ SOLN
INTRAMUSCULAR | Status: DC | PRN
  Administered 2012-09-21: 30 mg via INTRAVENOUS

## 2012-09-21 MED ORDER — OXYBUTYNIN CHLORIDE 5 MG PO TABS
5.0000 mg | ORAL_TABLET | Freq: Three times a day (TID) | ORAL | Status: AC
  Administered 2012-09-21: 5 mg via ORAL
  Filled 2012-09-21: qty 1

## 2012-09-21 MED ORDER — IOHEXOL 350 MG/ML SOLN
INTRAVENOUS | Status: DC | PRN
  Administered 2012-09-21: 14 mL

## 2012-09-21 MED ORDER — LACTATED RINGERS IV SOLN
INTRAVENOUS | Status: DC
  Filled 2012-09-21: qty 1000

## 2012-09-21 MED ORDER — LACTATED RINGERS IV SOLN
INTRAVENOUS | Status: DC
  Administered 2012-09-21: 12:00:00 via INTRAVENOUS
  Filled 2012-09-21: qty 1000

## 2012-09-21 MED ORDER — FENTANYL CITRATE 0.05 MG/ML IJ SOLN
INTRAMUSCULAR | Status: DC | PRN
  Administered 2012-09-21 (×2): 25 ug via INTRAVENOUS
  Administered 2012-09-21: 50 ug via INTRAVENOUS
  Administered 2012-09-21 (×4): 25 ug via INTRAVENOUS

## 2012-09-21 MED ORDER — LACTATED RINGERS IV SOLN
INTRAVENOUS | Status: DC | PRN
  Administered 2012-09-21 (×2): via INTRAVENOUS

## 2012-09-21 MED ORDER — LIDOCAINE HCL (CARDIAC) 20 MG/ML IV SOLN
INTRAVENOUS | Status: DC | PRN
  Administered 2012-09-21: 60 mg via INTRAVENOUS

## 2012-09-21 MED ORDER — FENTANYL CITRATE 0.05 MG/ML IJ SOLN
25.0000 ug | INTRAMUSCULAR | Status: DC | PRN
  Administered 2012-09-21: 25 ug via INTRAVENOUS
  Filled 2012-09-21: qty 1

## 2012-09-21 MED ORDER — MIDAZOLAM HCL 5 MG/5ML IJ SOLN
INTRAMUSCULAR | Status: DC | PRN
  Administered 2012-09-21 (×2): 1 mg via INTRAVENOUS

## 2012-09-21 MED ORDER — ONDANSETRON HCL 4 MG/2ML IJ SOLN
4.0000 mg | Freq: Four times a day (QID) | INTRAMUSCULAR | Status: DC | PRN
  Filled 2012-09-21: qty 2

## 2012-09-21 SURGICAL SUPPLY — 37 items
ADAPTER CATH URET PLST 4-6FR (CATHETERS) IMPLANT
ADPR CATH URET STRL DISP 4-6FR (CATHETERS)
BAG DRAIN URO-CYSTO SKYTR STRL (DRAIN) ×3 IMPLANT
BAG DRN UROCATH (DRAIN) ×2
BASKET LASER NITINOL 1.9FR (BASKET) IMPLANT
BASKET STNLS GEMINI 4WIRE 3FR (BASKET) IMPLANT
BASKET ZERO TIP NITINOL 2.4FR (BASKET) IMPLANT
BRUSH URET BIOPSY 3F (UROLOGICAL SUPPLIES) IMPLANT
BSKT STON RTRVL 120 1.9FR (BASKET)
BSKT STON RTRVL GEM 120X11 3FR (BASKET)
BSKT STON RTRVL ZERO TP 2.4FR (BASKET)
CANISTER SUCT LVC 12 LTR MEDI- (MISCELLANEOUS) ×2 IMPLANT
CATH INTERMIT  6FR 70CM (CATHETERS) ×3 IMPLANT
CATH URET 5FR 28IN CONE TIP (BALLOONS)
CATH URET 5FR 28IN OPEN ENDED (CATHETERS) IMPLANT
CATH URET 5FR 70CM CONE TIP (BALLOONS) IMPLANT
CLOTH BEACON ORANGE TIMEOUT ST (SAFETY) ×3 IMPLANT
DRAPE CAMERA CLOSED 9X96 (DRAPES) ×3 IMPLANT
ELECT REM PT RETURN 9FT ADLT (ELECTROSURGICAL)
ELECTRODE REM PT RTRN 9FT ADLT (ELECTROSURGICAL) IMPLANT
GLOVE BIO SURGEON STRL SZ8 (GLOVE) ×3 IMPLANT
GLOVE INDICATOR 6.5 STRL GRN (GLOVE) ×2 IMPLANT
GOWN PREVENTION PLUS LG XLONG (DISPOSABLE) ×3 IMPLANT
GOWN STRL NON-REIN LRG LVL3 (GOWN DISPOSABLE) ×2 IMPLANT
GOWN STRL REIN XL XLG (GOWN DISPOSABLE) ×3 IMPLANT
GUIDEWIRE 0.038 PTFE COATED (WIRE) IMPLANT
GUIDEWIRE ANG ZIPWIRE 038X150 (WIRE) IMPLANT
GUIDEWIRE STR DUAL SENSOR (WIRE) IMPLANT
IV NS IRRIG 3000ML ARTHROMATIC (IV SOLUTION) ×6 IMPLANT
KIT BALLIN UROMAX 15FX10 (LABEL) IMPLANT
KIT BALLN UROMAX 15FX4 (MISCELLANEOUS) IMPLANT
KIT BALLN UROMAX 26 75X4 (MISCELLANEOUS)
LASER FIBER DISP (UROLOGICAL SUPPLIES) ×2 IMPLANT
PACK CYSTOSCOPY (CUSTOM PROCEDURE TRAY) ×3 IMPLANT
SET HIGH PRES BAL DIL (LABEL)
SHEATH ACCESS URETERAL 38CM (SHEATH) ×2 IMPLANT
SHEATH ACCESS URETERAL 54CM (SHEATH) IMPLANT

## 2012-09-21 NOTE — Anesthesia Procedure Notes (Signed)
Procedure Name: LMA Insertion Date/Time: 09/21/2012 12:26 PM Performed by: Jessica Priest Pre-anesthesia Checklist: Patient identified, Emergency Drugs available, Suction available and Patient being monitored Patient Re-evaluated:Patient Re-evaluated prior to inductionOxygen Delivery Method: Circle System Utilized Preoxygenation: Pre-oxygenation with 100% oxygen Intubation Type: IV induction Ventilation: Mask ventilation without difficulty LMA: LMA inserted LMA Size: 4.0 Number of attempts: 1 Airway Equipment and Method: bite block Placement Confirmation: positive ETCO2 Tube secured with: Tape Dental Injury: Teeth and Oropharynx as per pre-operative assessment

## 2012-09-21 NOTE — Interval H&P Note (Signed)
History and Physical Interval Note:  09/21/2012 12:16 PM  Glenda Klein  has presented today for surgery, with the diagnosis of RIGHT RENAL CALCULI  The various methods of treatment have been discussed with the patient and family. After consideration of risks, benefits and other options for treatment, the patient has consented to  Procedure(s): CYSTOSCOPY WITH URETEROSCOPY, STONE BASKETRY (Right) HOLMIUM LASER APPLICATION (Right) as a surgical intervention .  The patient's history has been reviewed, patient examined, no change in status, stable for surgery.  I have reviewed the patient's chart and labs.  Questions were answered to the patient's satisfaction.     Chelsea Aus

## 2012-09-21 NOTE — Anesthesia Postprocedure Evaluation (Signed)
Anesthesia Post Note  Patient: Glenda Klein  Procedure(s) Performed: Procedure(s) (LRB): CYSTOSCOPY WITH URETEROSCOPY, STONE BASKETRY (Right) HOLMIUM LASER APPLICATION (Right)  Anesthesia type: General  Patient location: PACU  Post pain: Pain level controlled  Post assessment: Post-op Vital signs reviewed  Last Vitals: BP 106/56  Pulse 74  Temp(Src) 36.4 C (Oral)  Resp 12  Ht 5\' 5"  (1.651 m)  Wt 138 lb (62.596 kg)  BMI 22.96 kg/m2  SpO2 100%  Post vital signs: Reviewed  Level of consciousness: sedated  Complications: No apparent anesthesia complications

## 2012-09-21 NOTE — H&P (Signed)
  H&P  Chief Complaint: Kidney stone  History of Present Illness: Glenda Klein is a 67 y.o. year old who presents at this time for cystoscopy, stent removal and ureteroscopic treatment of her right UPJ and right renal calculi.  On 29 June, she presented with pyuria, fever and an obstructing right ureteropelvic junction stone. She underwent urgent stenting, antibiotic management, and returns for followup. She went home the day after her procedure. She has minimal stent discomfort. She has had no fever. She just completed antibiotics. She denies any prior episodes of stones, but there were bilateral calculi noted.     Past Medical History  Diagnosis Date  . Depression   . Hyperlipidemia   . Renal calculi     RIGHT  . Mild acid reflux   . Frequency of urination   . Urgency of urination   . Hematuria   . Arthritis     Past Surgical History  Procedure Laterality Date  . Inner ear surgery Right yrs ago  . Wrist surgery Right 2010  . Cystoscopy with stent placement Right 08/20/2012    Procedure: CYSTOSCOPY WITH STENT PLACEMENT;  Surgeon: Marcine Matar, MD;  Location: [facility];  Service: Urology;  Laterality: Right;  . Knee arthroscopy Left 2012  . Necklift      Home Medications:  No prescriptions prior to admission    Allergies:  Allergies  Allergen Reactions  . Statins Other (See Comments)    Extremity weakness    No family history on file.  Social History:  reports that she quit smoking about 34 years ago. Her smoking use included Cigarettes. She has a 5 pack-year smoking history. She has never used smokeless tobacco. She reports that she does not drink alcohol or use illicit drugs.  ROS: A complete review of systems was performed.  All systems are negative except for pertinent findings as noted.  Physical Exam:  Vital signs in last 24 hours:   General:  Alert and oriented, No acute distress HEENT: Normocephalic, atraumatic Neck: No JVD or  lymphadenopathy Cardiovascular: Regular rate and rhythm Lungs: Clear bilaterally Abdomen: Soft, nontender, nondistended, no abdominal masses Back: No CVA tenderness Extremities: No edema Neurologic: Grossly intact  Laboratory Data:  No results found for this or any previous visit (from the past 24 hour(s)). No results found for this or any previous visit (from the past 240 hour(s)). Creatinine: No results found for this basename: CREATININE,  in the last 168 hours  Radiologic Imaging: No results found.  Impression/Assessment:  Right UPJ stone with infection, status post stenting an antibiotic management. She presents now for treatment  Plan:  Right ureteroscopy following extraction of stent. She will have her right UPJ stone treated, as well as any remaining right renal stone burden.  Chelsea Aus 09/21/2012, 9:05 AM  Bertram Millard. Marisol Giambra MD

## 2012-09-21 NOTE — Op Note (Signed)
Preoperative diagnosis: History of right ureteral/UVJ stone, with infection, status post urgent stenting  Postoperative diagnosis: Same, with no evidence of ureteral or renal stone   Procedure: Cystoscopy, right double-J stent extraction, right ureteroscopy-rigid and flexible, right retrograde pyelogram with interpretation of fluoroscopy    Surgeon: Bertram Millard. Kyliah Deanda, M.D.   Anesthesia: Gen.   Complications: None  Specimen(s): None  Drain(s): None  Indications: 67 year-old female with history of right UPJ stone, status post urgent stenting in June, 2014, as this was associated with pyuria and fever. The patient presents at this time for stent removal and ureteroscopy with holmium laser use of necessary, and extraction of right ureteral stone. She is aware of the risks and complications of the procedure which have been discussed with her. She desires to proceed.    Technique and findings: The patient was properly identified and marked in the holding area. She received preoperative IV Cipro. She was taken the operating room where general anesthetic was administered with the LMA. She was placed in the dorsolithotomy position. Genitalia and perineum were prepped and draped. Proper timeout was then performed.  I then placed a 22 French cystoscope in the bladder. The bladder was inspected circumferentially. I saw no stones, no urothelial abnormalities, and the stent protruding from the right ureteral orifice. The stent was grasped and brought up to the urethra. I then negotiated a guidewire through the distal stent, and using fluoroscopy this was advanced into the right renal pelvis where good curl was seen. I then removed the stent over top of the guidewire, leaving the guidewire in place. I then passed a 6 [place] short rigid ureteroscope easily up through the ureter. The entire ureter was inspected. No abnormalities or stones were seen. I then got up to the renal pelvis, without any stones  identified.  As the patient needed inspection of her pyelo-calyceal system at this point, I passed, over the guidewire, a medium ureteral access sheath. The inner core was removed as well as the guidewire. I then negotiated the flexible digital ureteroscope through the access sheath and into the right pyelo-calyceal system. A retrograde study of the right pyelo-calyceal system was then performed using Omnipaque. This revealed 3 major calyces-1 lower pole, one interpolar and 1 upper pole. I then first inspected the upper pole calyceal system directly with the digital ureteroscope. Each  minor calyx was entered and inspected. There were some punctate calcifications on the papillae, but no stone was seen in any of the upper pole calyces. I then inspected the right renal pelvis on the right. Again, no stone was seen. Obvious inflammation from the previous stent placement was noted, however. The interpolar calyceal system was identified/inspected, and the smaller calyces entered. Again, there were punctate calcifications on some of the papillae, but no stone was seen. I then inspected the lower pole calyceal system. Again, no stone was seen. Each calyceal system was again reinspected twice, with no stone evident. At this point, I felt that the stone that had been present at the UPJ was either missed with the inspection  ( unlikely), or perhaps fragmented with the prior stent placement, and passed along the stent.  After thorough inspection of each calyceal system 3 times, I then removed the flexible ureteroscope, inspecting the entire ureter throughout the removal of the scope. Additionally, the access sheath was removed at the same time. I then, following extraction of the ureteroscope, inspected the bladder again with the cystoscope. No stone was seen. At this point, the bladder  was emptied and the procedure terminated. The patient was awakened and taken to the PACU in stable condition. She tolerated the procedure  well.

## 2012-09-21 NOTE — Anesthesia Preprocedure Evaluation (Signed)
Anesthesia Evaluation  Patient identified by MRN, date of birth, ID band Patient awake    Reviewed: Allergy & Precautions, H&P , NPO status , Patient's Chart, lab work & pertinent test results  Airway Mallampati: II TM Distance: >3 FB Neck ROM: full    Dental  (+) Caps and Dental Advisory Given All upper front are capped:   Pulmonary neg pulmonary ROS,  breath sounds clear to auscultation  Pulmonary exam normal       Cardiovascular Exercise Tolerance: Good negative cardio ROS  Rhythm:regular Rate:Normal     Neuro/Psych negative neurological ROS  negative psych ROS   GI/Hepatic negative GI ROS, Neg liver ROS, GERD-  Controlled,  Endo/Other  negative endocrine ROS  Renal/GU negative Renal ROS  negative genitourinary   Musculoskeletal   Abdominal   Peds  Hematology negative hematology ROS (+)   Anesthesia Other Findings   Reproductive/Obstetrics negative OB ROS                           Anesthesia Physical Anesthesia Plan  ASA: II  Anesthesia Plan: General   Post-op Pain Management:    Induction: Intravenous  Airway Management Planned: LMA  Additional Equipment:   Intra-op Plan:   Post-operative Plan:   Informed Consent: I have reviewed the patients History and Physical, chart, labs and discussed the procedure including the risks, benefits and alternatives for the proposed anesthesia with the patient or authorized representative who has indicated his/her understanding and acceptance.   Dental Advisory Given  Plan Discussed with: CRNA and Surgeon  Anesthesia Plan Comments:         Anesthesia Quick Evaluation

## 2012-09-21 NOTE — Transfer of Care (Signed)
Immediate Anesthesia Transfer of Care Note  Patient: Glenda Klein  Procedure(s) Performed: Procedure(s) (LRB): CYSTOSCOPY WITH URETEROSCOPY, STONE BASKETRY (Right) HOLMIUM LASER APPLICATION (Right)  Patient Location: PACU  Anesthesia Type: General  Level of Consciousness: awake, sedated, patient cooperative and responds to stimulation  Airway & Oxygen Therapy: Patient Spontanous Breathing and Patient connected to face mask oxygen  Post-op Assessment: Report given to PACU RN, Post -op Vital signs reviewed and stable and Patient moving all extremities  Post vital signs: Reviewed and stable  Complications: No apparent anesthesia complications

## 2012-09-22 ENCOUNTER — Encounter (HOSPITAL_BASED_OUTPATIENT_CLINIC_OR_DEPARTMENT_OTHER): Payer: Self-pay | Admitting: Urology

## 2012-09-22 LAB — POCT HEMOGLOBIN-HEMACUE: Hemoglobin: 12.8 g/dL (ref 12.0–15.0)

## 2012-09-25 ENCOUNTER — Encounter (HOSPITAL_BASED_OUTPATIENT_CLINIC_OR_DEPARTMENT_OTHER): Payer: Self-pay | Admitting: Urology

## 2012-10-08 ENCOUNTER — Encounter (HOSPITAL_BASED_OUTPATIENT_CLINIC_OR_DEPARTMENT_OTHER): Payer: Self-pay | Admitting: Urology

## 2013-06-01 ENCOUNTER — Encounter (HOSPITAL_COMMUNITY): Payer: Self-pay | Admitting: Emergency Medicine

## 2013-06-01 ENCOUNTER — Emergency Department (HOSPITAL_COMMUNITY)
Admission: EM | Admit: 2013-06-01 | Discharge: 2013-06-01 | Disposition: A | Discharge status: Other Acute Inpatient Hospital | Payer: Medicare Other | Attending: Emergency Medicine | Admitting: Emergency Medicine

## 2013-06-01 DIAGNOSIS — Z8742 Personal history of other diseases of the female genital tract: Secondary | ICD-10-CM | POA: Insufficient documentation

## 2013-06-01 DIAGNOSIS — F3289 Other specified depressive episodes: Secondary | ICD-10-CM | POA: Insufficient documentation

## 2013-06-01 DIAGNOSIS — F329 Major depressive disorder, single episode, unspecified: Secondary | ICD-10-CM | POA: Insufficient documentation

## 2013-06-01 DIAGNOSIS — F32A Depression, unspecified: Secondary | ICD-10-CM

## 2013-06-01 DIAGNOSIS — E785 Hyperlipidemia, unspecified: Secondary | ICD-10-CM | POA: Insufficient documentation

## 2013-06-01 DIAGNOSIS — Z8739 Personal history of other diseases of the musculoskeletal system and connective tissue: Secondary | ICD-10-CM | POA: Insufficient documentation

## 2013-06-01 DIAGNOSIS — Z87442 Personal history of urinary calculi: Secondary | ICD-10-CM | POA: Insufficient documentation

## 2013-06-01 DIAGNOSIS — R45851 Suicidal ideations: Secondary | ICD-10-CM | POA: Insufficient documentation

## 2013-06-01 DIAGNOSIS — Z87891 Personal history of nicotine dependence: Secondary | ICD-10-CM | POA: Insufficient documentation

## 2013-06-01 DIAGNOSIS — Z79899 Other long term (current) drug therapy: Secondary | ICD-10-CM | POA: Insufficient documentation

## 2013-06-01 DIAGNOSIS — Z8719 Personal history of other diseases of the digestive system: Secondary | ICD-10-CM | POA: Insufficient documentation

## 2013-06-01 LAB — SALICYLATE LEVEL: Salicylate Lvl: <2 mg/dL — ABNORMAL LOW (ref 2.8–20.0)

## 2013-06-01 LAB — RAPID URINE DRUG SCREEN, HOSP PERFORMED
Amphetamines: NOT DETECTED
BARBITURATES: NOT DETECTED
Benzodiazepines: NOT DETECTED
COCAINE: NOT DETECTED
OPIATES: NOT DETECTED
TETRAHYDROCANNABINOL: NOT DETECTED

## 2013-06-01 LAB — COMPREHENSIVE METABOLIC PANEL
ALT: 13 U/L (ref 0–35)
AST: 15 U/L (ref 0–37)
Albumin: 4.1 g/dL (ref 3.5–5.2)
Alkaline Phosphatase: 78 U/L (ref 39–117)
BUN: 16 mg/dL (ref 6–23)
CALCIUM: 10.2 mg/dL (ref 8.4–10.5)
CO2: 25 meq/L (ref 19–32)
Chloride: 103 mEq/L (ref 96–112)
Creatinine, Ser: 0.66 mg/dL (ref 0.50–1.10)
GFR, EST NON AFRICAN AMERICAN: 89 mL/min — AB (ref >90)
GLUCOSE: 89 mg/dL (ref 70–99)
Potassium: 4.2 mEq/L (ref 3.7–5.3)
SODIUM: 143 meq/L (ref 137–147)
TOTAL PROTEIN: 7.5 g/dL (ref 6.0–8.3)
Total Bilirubin: 0.2 mg/dL — ABNORMAL LOW (ref 0.3–1.2)

## 2013-06-01 LAB — CBC
HEMATOCRIT: 33 % — AB (ref 36.0–46.0)
HEMOGLOBIN: 10.8 g/dL — AB (ref 12.0–15.0)
MCH: 27.8 pg (ref 26.0–34.0)
MCHC: 32.7 g/dL (ref 30.0–36.0)
MCV: 85.1 fL (ref 78.0–100.0)
Platelets: 364 10*3/uL (ref 150–400)
RBC: 3.88 MIL/uL (ref 3.87–5.11)
RDW: 15.8 % — ABNORMAL HIGH (ref 11.5–15.5)
WBC: 7.8 10*3/uL (ref 4.0–10.5)

## 2013-06-01 LAB — ACETAMINOPHEN LEVEL: Acetaminophen (Tylenol), Serum: <15 ug/mL (ref 10–30)

## 2013-06-01 LAB — ETHANOL

## 2013-06-01 MED ORDER — ACETAMINOPHEN 325 MG PO TABS: 650.0000 mg | ORAL_TABLET | ORAL | Status: DC | PRN

## 2013-06-01 MED ORDER — SERTRALINE HCL 50 MG PO TABS: 100.0000 mg | ORAL_TABLET | Freq: Every day | ORAL | Status: DC

## 2013-06-01 MED ORDER — EZETIMIBE 10 MG PO TABS: 10.0000 mg | ORAL_TABLET | Freq: Every day | ORAL | Status: DC

## 2013-06-01 MED ORDER — ONDANSETRON HCL 4 MG PO TABS: 4.0000 mg | ORAL_TABLET | Freq: Three times a day (TID) | ORAL | Status: DC | PRN

## 2013-06-01 MED ORDER — LORAZEPAM 1 MG PO TABS: 1.0000 mg | ORAL_TABLET | Freq: Three times a day (TID) | ORAL | Status: DC | PRN

## 2013-06-01 NOTE — ED Notes (Signed)
Patient is alert and oriented x3.  She was given DC instructions and follow up visit instructions.  Patient gave verbal understanding. She was DC ambulatory to [place].  V/S stable.  SHe was not showing any signs of distress on DC

## 2013-06-01 NOTE — BH Assessment (Addendum)
Called RN at [facility] to perform tele-assessment. She will move Pt to a private area and call TTS at [phone] when tele-cart is ready.  Orpah Greek Rosana Hoes, [facility] Triage Specialist [phone]

## 2013-06-01 NOTE — BH Assessment (Signed)
Tele Assessment Note   Glenda Klein is an 68 y.o. female, separated, Caucasian who presents to [facility] ED voluntarily accompanied by Event organiser. Pt reports she has a history of bipolar disorder and has recently become increasingly depressed. Pt reports feeling suicidal tonight and was searching online for methods to commit suicide. She called a suicide hotline and law enforcement was sent to her house to check her welfare and she was escorted to the ED. Pt reports symptoms including crying spells, poor sleep, decreased appetite and feelings of hopelessness and being overwhelmed. She continues to verbalize suicidal ideation but doesn't have a specific plan yet. She states she had a close friend commit suicide which has made her thinking of committing suicide herself. She denies any history of suicidal gestures. Pt reports homicidal thoughts towards her husband, with whom she is separated and in the process of divorcing. She has no plan or intent to harm her husband. She denies any history of violence. She denies any access to firearms. Pt denies any auditory or visual hallucinations. Pt denies any alcohol or substance use.  Pt identifies her primary stressor as her divorce from her husband. Pt states she and her husband were married for 29 years and he had a girlfriend for 34 of those years. He went to live with the girlfriend in 2013. Pt states that as part of the divorce he wants half of her money. 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 She said her lawyer is very expensive and has told Pt that legally her husband will get half of Pt's saving. Pt says she feels overwhelmed, that she is 68 years old and can't start over, "I don't see any future" and that she just wants to give up. She says she has attended a "divorce care" support group and she has a 69 year old daughter but  otherwise doesn't feel she has a lot of support. 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 Pt denies any legal problems other than her divorce proceeding. She denies any chronic medical problems.  Pt reports she was diagnosed with bipolar disorder by Dr. Milagros Loll "years ago." She is currently prescribed Zoloft 100 mg daily which is prescribed by her primary care physician, Kelton Pillar. She denies any other current outpatient mental health treatment. She denies any history of inpatient mental health treatment.  Pt is dressed in a hospital gown, well groomed, alert and oriented x4. She presents with normal speech and normal motor behavior. Eye contact is good. Her thought process is coherent and goal directed. There is no evidence Pt is responding to internal stimuli or experiencing delusional thought content. He memory and concentration appear within normal limits. Pt's mood is depressed and affect is congruent with mood. Pt was calm and cooperative throughout assessment. She is willing to sign voluntarily into inpatient psychiatric treatment.   Axis I: 296.53 Bipolar I Disorder, current episode depressed, severe Axis II: Deferred Axis III:  Past Medical History  Diagnosis Date  . Depression   . Hyperlipidemia   . Renal calculi     RIGHT  . Mild acid reflux   . Frequency of urination   . Urgency of urination   . Hematuria   . Arthritis    Axis IV: economic problems and problems with primary support group Axis V: GAF=30  Past Medical  History:  Past Medical History  Diagnosis Date  . Depression   . Hyperlipidemia   . Renal calculi     RIGHT  . Mild acid reflux   . Frequency of urination   . Urgency of urination   . Hematuria   . Arthritis     Past Surgical History  Procedure Laterality Date  . Inner ear surgery Right yrs ago  . Wrist surgery Right 2010  . Cystoscopy with stent placement Right 08/20/2012    Procedure: CYSTOSCOPY WITH STENT PLACEMENT;  Surgeon:  Franchot Gallo, MD;  Location: [facility];  Service: Urology;  Laterality: Right;  . Knee arthroscopy Left 2012  . Necklift    . Cystoscopy with ureteroscopy, stone basketry and stent placement Right 09/21/2012    Procedure: CYSTOSCOPY WITH URETEROSCOPY, Retrograde Pyelogram;  Surgeon: Franchot Gallo, MD;  Location: [facility];  Service: Urology;  Laterality: Right;    Family History: No family history on file.  Social History:  reports that she quit smoking about 34 years ago. Her smoking use included Cigarettes. She has a 5 pack-year smoking history. She has never used smokeless tobacco. She reports that she does not drink alcohol or use illicit drugs.  Additional Social History:  Alcohol / Drug Use Pain Medications: None Prescriptions: None Over the Counter: None History of alcohol / drug use?: No history of alcohol / drug abuse Longest period of sobriety (when/how long): NA  CIWA: CIWA-Ar BP: 120/81 mmHg Pulse Rate: 82 COWS:    Allergies:  Allergies  Allergen Reactions  . Statins Other (See Comments)    Extremity weakness    Home Medications:  (Not in a hospital admission)  OB/GYN Status:  No LMP recorded. Patient is postmenopausal.  General Assessment Data Location of Assessment: [facility] Is this a Tele or Face-to-Face Assessment?: Tele Assessment Is this an Initial Assessment or a Re-assessment for this encounter?: Initial Assessment Living Arrangements: Alone Can pt return to current living arrangement?: Yes Admission Status: Voluntary Is patient capable of signing voluntary admission?: Yes Transfer from: [facility] Referral Source: Self/Family/Friend     [place] Living Arrangements: Alone Name of Psychiatrist: None Name of Therapist: None  Education Status Is patient currently in school?: No Current Grade: NA Highest grade of school patient has completed: NA Name of school: NA Contact person: NA  Risk to self Suicidal  Ideation: Yes-Currently Present Suicidal Intent: Yes-Currently Present Is patient at risk for suicide?: Yes Suicidal Plan?: No (Researched methods on internet) Access to Means: No What has been your use of drugs/alcohol within the last 12 months?: Pt denies Previous Attempts/Gestures: No How many times?: 0 Other Self Harm Risks: None Triggers for Past Attempts: None known Intentional Self Injurious Behavior: None Family Suicide History: No;See progress notes Recent stressful life event(s): Divorce;Financial Problems Persecutory voices/beliefs?: No Depression: Yes Depression Symptoms: Despondent;Tearfulness;Fatigue;Feeling worthless/self pity;Feeling angry/irritable Substance abuse history and/or treatment for substance abuse?: No Suicide prevention information given to non-admitted patients: Not applicable  Risk to Others Homicidal Ideation: Yes-Currently Present Thoughts of Harm to Others: Yes-Currently Present Comment - Thoughts of Harm to Others: Thoughts of hurting husband Current Homicidal Intent: No Current Homicidal Plan: No Access to Homicidal Means: No Identified Victim: Husband History of harm to others?: No Assessment of Violence: None Noted Violent Behavior Description: None Does patient have access to weapons?: No Criminal Charges Pending?: No Does patient have a court date: No  Psychosis Hallucinations: None noted Delusions: None noted  Mental Status Report  Appear/Hygiene: Other (Comment) [facility] gown) Eye Contact: Good Motor Activity: Unremarkable Speech: Logical/coherent Level of Consciousness: Alert Mood: Depressed;Despair Affect: Depressed Anxiety Level: None Thought Processes: Coherent;Relevant Judgement: Unimpaired Orientation: Person;Place;Time;Situation;Appropriate for developmental age Obsessive Compulsive Thoughts/Behaviors: None  Cognitive Functioning Concentration: Normal Memory: Recent Intact;Remote Intact IQ: Average Insight:  Good Impulse Control: Good Appetite: Poor Weight Loss: 0 (Unknown) Weight Gain: 0 Sleep: Decreased Total Hours of Sleep: 6 Vegetative Symptoms: None  ADLScreening [facility] Assessment Services) Patient's cognitive ability adequate to safely complete daily activities?: Yes Patient able to express need for assistance with ADLs?: Yes Independently performs ADLs?: Yes (appropriate for developmental age)  Prior Inpatient Therapy Prior Inpatient Therapy: No Prior Therapy Dates: NA Prior Therapy Facilty/Provider(s): NA Reason for Treatment: NA  Prior Outpatient Therapy Prior Outpatient Therapy: Yes Prior Therapy Dates: "Years ago" Prior Therapy Facilty/Provider(s): Milagros Loll, MD Reason for Treatment: Bipolar disorder  ADL Screening (condition at time of admission) Patient's cognitive ability adequate to safely complete daily activities?: Yes Is the patient deaf or have difficulty hearing?: No Does the patient have difficulty seeing, even when wearing glasses/contacts?: No Does the patient have difficulty concentrating, remembering, or making decisions?: No Patient able to express need for assistance with ADLs?: Yes Does the patient have difficulty dressing or bathing?: No Independently performs ADLs?: Yes (appropriate for developmental age) Does the patient have difficulty walking or climbing stairs?: No Weakness of Legs: None Weakness of Arms/Hands: None  Home Assistive Devices/Equipment Home Assistive Devices/Equipment: None    Abuse/Neglect Assessment (Assessment to be complete while patient is alone) Physical Abuse: Denies Verbal Abuse: Denies Exploitation of patient/patient's resources: Denies Self-Neglect: Denies Values / Beliefs Cultural Requests During Hospitalization: None Spiritual Requests During Hospitalization: None   Advance Directives (For Healthcare) Advance Directive: Patient has advance directive, copy not in chart Type of Advance Directive: Living  will Does patient want anything changed on advanced directive?: No change requested Advance Directive not in Chart: Copy requested from other (Comment) Nutrition Screen- [facility] Patient's home diet: Regular  Additional Information 1:1 In Past 12 Months?: No CIRT Risk: No Elopement Risk: No Does patient have medical clearance?: Yes     Disposition: Clayborne Dana, AC at [facility], confirms bed availability. Gave clinical report to Serena Colonel, NP who accepted Pt to the service of Dr. Mylinda Latina, room 502-1. Notified Dr. Ripley Fraise and Colletta Maryland, RN of acceptance.   Disposition Initial Assessment Completed for this Encounter: Yes Disposition of Patient: Inpatient treatment program Type of inpatient treatment program: Adult  Evelena Peat, [facility] Triage Specialist [phone]    Orpah Greek Anson Fret. 06/01/2013 10:04 PM

## 2013-06-01 NOTE — BH Assessment (Addendum)
Received call for assessment. Spoke with Ripley Fraise, MD who said Pt is depressed and suicidal due to divorce from her husband of 74 years. Tele-assessment will be initiated.  Orpah Greek Rosana Hoes, [facility] Triage Specialist [phone]

## 2013-06-01 NOTE — ED Notes (Signed)
Glenda Klein [phone], daughter wants to be contacted if there is any change or pt is d/c

## 2013-06-01 NOTE — BH Assessment (Signed)
Assessment complete. Clayborne Dana, [facility] at [facility], confirms bed availability. Gave clinical report to Serena Colonel, NP who accepted Pt to the service of Dr. Mylinda Latina, room 502-1. Notified Dr. Ripley Fraise and Colletta Maryland, RN of acceptance.   Orpah Greek Rosana Hoes, [facility] Triage Specialist [phone]

## 2013-06-01 NOTE — ED Provider Notes (Signed)
CSN: [id number]     Arrival date & time 06/01/13  1612 History   First MD Initiated Contact with Patient 06/01/13 1743     Chief Complaint  Patient presents with  . Medical Clearance      Patient is a 68 y.o. female presenting with mental health disorder. The history is provided by the patient.  Mental Health Problem Presenting symptoms: depression and suicidal thoughts   Degree of incapacity (severity):  Moderate Onset quality:  Gradual Timing:  Constant Progression:  Worsening Chronicity:  New Relieved by:  Nothing Worsened by:  Nothing tried Associated symptoms: no abdominal pain, no chest pain and no headaches   pt reports depression for "Awhile" with worsening recently and now endorses SI She has not made any suicide attempt as of yet She reports she is going through a divorce which has triggered this episode   Past Medical History  Diagnosis Date  . Depression   . Hyperlipidemia   . Renal calculi     RIGHT  . Mild acid reflux   . Frequency of urination   . Urgency of urination   . Hematuria   . Arthritis    Past Surgical History  Procedure Laterality Date  . Inner ear surgery Right yrs ago  . Wrist surgery Right 2010  . Cystoscopy with stent placement Right 08/20/2012    Procedure: CYSTOSCOPY WITH STENT PLACEMENT;  Surgeon: Franchot Gallo, MD;  Location: [facility];  Service: Urology;  Laterality: Right;  . Knee arthroscopy Left 2012  . Necklift    . Cystoscopy with ureteroscopy, stone basketry and stent placement Right 09/21/2012    Procedure: CYSTOSCOPY WITH URETEROSCOPY, Retrograde Pyelogram;  Surgeon: Franchot Gallo, MD;  Location: [facility];  Service: Urology;  Laterality: Right;   No family history on file. History  Substance Use Topics  . Smoking status: Former Smoker -- 0.50 packs/day for 10 years    Types: Cigarettes    Quit date: 09/15/1978  . Smokeless tobacco: Never Used  . Alcohol Use: No   OB History   Grav Para Term  Preterm Abortions TAB SAB Ect Mult Living                 Review of Systems  Constitutional: Negative for fever.  Respiratory: Negative for shortness of breath.   Cardiovascular: Negative for chest pain.  Gastrointestinal: Negative for vomiting and abdominal pain.  Neurological: Negative for headaches.  Psychiatric/Behavioral: Positive for suicidal ideas.  All other systems reviewed and are negative.     Allergies  Statins  Home Medications   Current Outpatient Rx  Name  Route  Sig  Dispense  Refill  . ezetimibe (ZETIA) 10 MG tablet   Oral   Take 10 mg by mouth daily.         . sertraline (ZOLOFT) 100 MG tablet   Oral   Take 100 mg by mouth daily.          BP 120/81  Pulse 82  Temp(Src) 98.3 F (36.8 C) (Oral)  Resp 20  SpO2 99% Physical Exam CONSTITUTIONAL: Well developed/well nourished HEAD: Normocephalic/atraumatic EYES: EOMI ENMT: Mucous membranes moist NECK: supple no meningeal signs CV: S1/S2 noted, no murmurs/rubs/gallops noted LUNGS: Lungs are clear to auscultation bilaterally, no apparent distress ABDOMEN: soft, nontender, no rebound or guarding NEURO: Pt is awake/alert, moves all extremitiesx4 EXTREMITIES: pulses normal, full ROM SKIN: warm, color normal PSYCH: mildly anxious  ED Course  Procedures  7:32 PM Pt here for depression/SI She  is stable at this time Will consult psych  Labs Review Labs Reviewed  CBC - Abnormal; Notable for the following:    Hemoglobin 10.8 (*)    HCT 33.0 (*)    RDW 15.8 (*)    All other components within normal limits  COMPREHENSIVE METABOLIC PANEL - Abnormal; Notable for the following:    Total Bilirubin 0.2 (*)    GFR calc non Af Amer 89 (*)    All other components within normal limits  SALICYLATE LEVEL - Abnormal; Notable for the following:    Salicylate Lvl <1.6 (*)    All other components within normal limits  ACETAMINOPHEN LEVEL  ETHANOL  URINE RAPID DRUG SCREEN (HOSP PERFORMED)    MDM    Final diagnoses:  Depression  Suicidal ideation    Nursing notes including past medical history and social history reviewed and considered in documentation Labs/vital reviewed and considered     Sharyon Cable, MD 06/01/13 1932

## 2013-06-01 NOTE — ED Notes (Signed)
Pt from home c/o SI. Pt is going through a divorce and feel that she doesn't want to continue living. Pt states that she has no reason to start over and she "does't want to do it anymore." Pt reports that she is SI, but has no plan, admits "researching it on the internet." Pt is calm and cooperative. Pt given sandwich and water per Dr. Christy Gentles.

## 2013-06-01 NOTE — ED Notes (Signed)
Pt states that she is in middle of a divorce of a marriage of 20+ years after her husband has been having an affair for 10 years and now pt is getting court notices where her ex is trying to get half of her retirement after she has worked most of her life for what she has and he has spent all his money on this other family.  Pt got insurance bill on the house that she cant afford by herself today that has pushed her to having SI but without a plan.  Pt states that she called the suicide hotline and cops came to her house and broght her here.   Pt states that she has an attorney the cost $300/hour that she cant afford and isnt going to help her anyways bc [place] is a 50/50 states.

## 2013-06-02 ENCOUNTER — Inpatient Hospital Stay (HOSPITAL_COMMUNITY)
Admission: AD | Admit: 2013-06-02 | Discharge: 2013-06-05 | DRG: 885 | Disposition: A | Discharge status: Home / Self Care | Payer: No Typology Code available for payment source | Attending: Psychiatry | Admitting: Psychiatry

## 2013-06-02 ENCOUNTER — Encounter (HOSPITAL_COMMUNITY): Payer: Self-pay

## 2013-06-02 DIAGNOSIS — E785 Hyperlipidemia, unspecified: Secondary | ICD-10-CM | POA: Diagnosis present

## 2013-06-02 DIAGNOSIS — F339 Major depressive disorder, recurrent, unspecified: Secondary | ICD-10-CM | POA: Diagnosis present

## 2013-06-02 DIAGNOSIS — Z598 Other problems related to housing and economic circumstances: Secondary | ICD-10-CM

## 2013-06-02 DIAGNOSIS — F411 Generalized anxiety disorder: Secondary | ICD-10-CM | POA: Diagnosis present

## 2013-06-02 DIAGNOSIS — R45851 Suicidal ideations: Secondary | ICD-10-CM

## 2013-06-02 DIAGNOSIS — F329 Major depressive disorder, single episode, unspecified: Principal | ICD-10-CM | POA: Diagnosis present

## 2013-06-02 DIAGNOSIS — Z87891 Personal history of nicotine dependence: Secondary | ICD-10-CM

## 2013-06-02 DIAGNOSIS — K219 Gastro-esophageal reflux disease without esophagitis: Secondary | ICD-10-CM | POA: Diagnosis present

## 2013-06-02 DIAGNOSIS — F3130 Bipolar disorder, current episode depressed, mild or moderate severity, unspecified: Secondary | ICD-10-CM

## 2013-06-02 DIAGNOSIS — M129 Arthropathy, unspecified: Secondary | ICD-10-CM | POA: Diagnosis present

## 2013-06-02 DIAGNOSIS — F410 Panic disorder [episodic paroxysmal anxiety] without agoraphobia: Secondary | ICD-10-CM | POA: Diagnosis present

## 2013-06-02 DIAGNOSIS — Z87442 Personal history of urinary calculi: Secondary | ICD-10-CM | POA: Diagnosis not present

## 2013-06-02 DIAGNOSIS — Z5987 Material hardship due to limited financial resources, not elsewhere classified: Secondary | ICD-10-CM

## 2013-06-02 DIAGNOSIS — Z5989 Other problems related to housing and economic circumstances: Secondary | ICD-10-CM | POA: Diagnosis not present

## 2013-06-02 MED ORDER — MAGNESIUM HYDROXIDE 400 MG/5ML PO SUSP
30.0000 mL | Freq: Every day | ORAL | Status: DC | PRN
Start: 2013-06-01 — End: 2013-06-05

## 2013-06-02 MED ORDER — EZETIMIBE 10 MG PO TABS
10.0000 mg | ORAL_TABLET | Freq: Every day | ORAL | Status: DC
  Administered 2013-06-02 – 2013-06-05 (×4): 10 mg via ORAL
  Filled 2013-06-02 (×6): qty 1

## 2013-06-02 MED ORDER — SERTRALINE HCL 100 MG PO TABS
100.0000 mg | ORAL_TABLET | Freq: Every day | ORAL | Status: DC
  Administered 2013-06-02 – 2013-06-05 (×4): 100 mg via ORAL
  Filled 2013-06-02 (×4): qty 1
  Filled 2013-06-02: qty 7
  Filled 2013-06-02: qty 1

## 2013-06-02 MED ORDER — HYDROXYZINE HCL 50 MG PO TABS
50.0000 mg | ORAL_TABLET | Freq: Every evening | ORAL | Status: DC | PRN
  Administered 2013-06-02 – 2013-06-03 (×3): 50 mg via ORAL
  Filled 2013-06-02 (×3): qty 1

## 2013-06-02 MED ORDER — ACETAMINOPHEN 325 MG PO TABS
650.0000 mg | ORAL_TABLET | Freq: Four times a day (QID) | ORAL | Status: DC | PRN
  Administered 2013-06-02: 650 mg via ORAL
  Filled 2013-06-02: qty 2

## 2013-06-02 MED ORDER — ALUM & MAG HYDROXIDE-SIMETH 200-200-20 MG/5ML PO SUSP: 30.0000 mL | ORAL | Status: DC | PRN

## 2013-06-02 NOTE — Tx Team (Signed)
Initial Interdisciplinary Treatment Plan  PATIENT STRENGTHS: (choose at least two) Ability for insight Motivation for treatment/growth  PATIENT STRESSORS: Financial difficulties Legal issue Marital or family conflict   PROBLEM LIST: Problem List/Patient Goals Date to be addressed Date deferred Reason deferred Estimated date of resolution  SI 06/02/2013     Depression                                                 DISCHARGE CRITERIA:  Ability to meet basic life and health needs Adequate post-discharge living arrangements Improved stabilization in mood, thinking, and/or behavior Medical problems require only outpatient monitoring Need for constant or close observation no longer present  PRELIMINARY DISCHARGE PLAN: Return to previous living arrangement  PATIENT/FAMIILY INVOLVEMENT: This treatment plan has been presented to and reviewed with the patient, Glenda Klein, and/or family member.  The patient and family have been given the opportunity to ask questions and make suggestions.  Aurora Mask 06/02/2013, 4:49 AM

## 2013-06-02 NOTE — Progress Notes (Signed)
Patient voluntarily admitted reporting increased depression d/t her husband of 29 years has a girlfriend with whom he has been in a relationship with for 10 years. She was told through an attorney that her husband is trying to get 1/2 of her assets. She reports that her stressor is legal issues. She reports that she has no joy in anything and wants to find a reason to live. Patient currently denies hi/a/v hallucinations, she reports passive si and verbally contracts for safety. She was pleasant and cooperative during admission, oriented to unit and safety maintained on unit with 15 min checks.

## 2013-06-02 NOTE — [facility] Suicide Risk Assessment (Signed)
   Nursing information obtained from:    Demographic factors:    Current Mental Status:    Loss Factors:    Historical Factors:    Risk Reduction Factors:    Total Time spent with patient: 20 minutes  CLINICAL FACTORS:   Severe Anxiety and/or Agitation Depression:   Anhedonia Hopelessness Impulsivity Insomnia Severe  Psychiatric Specialty Exam: Physical Exam  Psychiatric: Her speech is normal. Her mood appears anxious. She is slowed. Cognition and memory are normal. She expresses impulsivity. She exhibits a depressed mood. She expresses suicidal ideation.    Review of Systems  Constitutional: Negative.   HENT: Negative.   Eyes: Negative.   Respiratory: Negative.   Cardiovascular: Negative.   Gastrointestinal: Negative.   Genitourinary: Negative.   Musculoskeletal: Negative.   Skin: Negative.   Neurological: Negative.   Endo/Heme/Allergies: Negative.   Psychiatric/Behavioral: Positive for depression and suicidal ideas. The patient is nervous/anxious and has insomnia.     Blood pressure 98/67, pulse 72, temperature 97.7 F (36.5 C), temperature source Oral, resp. rate 16, height 5\' 2"  (1.575 m), weight 66.679 kg (147 lb), SpO2 92.00%.Body mass index is 26.88 kg/(m^2).  General Appearance: Fairly Groomed  Engineer, water::  Minimal  Speech:  Clear and Coherent  Volume:  Decreased  Mood:  Depressed and Hopeless  Affect:  Constricted  Thought Process:  Goal Directed  Orientation:  Full (Time, Place, and Person)  Thought Content:  Negative  Suicidal Thoughts:  Yes.  without intent/plan  Homicidal Thoughts:  No  Memory:  Immediate;   Fair Recent;   Fair Remote;   Fair  Judgement:  Impaired  Insight:  Shallow  Psychomotor Activity:  Decreased  Concentration:  Poor  Recall:  [employer] of Knowledge:Good  Language: Fair  Akathisia:  No  Handed:    AIMS (if indicated):     Assets:  Communication Skills Desire for Improvement Physical Health  Sleep:  Number of Hours:  3.5   Musculoskeletal: Strength & Muscle Tone: within normal limits Gait & Station: normal Patient leans: N/A  COGNITIVE FEATURES THAT CONTRIBUTE TO RISK:  Closed-mindedness    SUICIDE RISK:   Mild:  Suicidal ideation of limited frequency, intensity, duration, and specificity.  There are no identifiable plans, no associated intent, mild dysphoria and related symptoms, good self-control (both objective and subjective assessment), few other risk factors, and identifiable protective factors, including available and accessible social support.  PLAN OF CARE:1. Admit for crisis management and stabilization. 2. Medication management to reduce current symptoms to base line and improve the     patient's overall level of functioning 3. Treat health problems as indicated. 4. Develop treatment plan to decrease risk of relapse upon discharge and the need for     readmission. 5. Psycho-social education regarding relapse prevention and self care. 6. Health care follow up as needed for medical problems. 7. Restart home medications where appropriate.   I certify that inpatient services furnished can reasonably be expected to improve the patient's condition.  Corena Pilgrim, MD 06/02/2013, 2:33 PM

## 2013-06-02 NOTE — H&P (Signed)
Psychiatric Admission Assessment Adult  Patient Identification:  Glenda Klein Date of Evaluation:  06/02/2013 Chief Complaint:  Bipolar I Disorder, Depressed History of Present Illness:: Glenda Klein is an 68 y.o. female, separated, Caucasian who presents to [facility] ED voluntarily accompanied by Event organiser. Pt reports she has a history of bipolar disorder and has recently become increasingly depressed. Pt reports feeling suicidal tonight and was searching online for methods to commit suicide. She called a suicide hotline and law enforcement was sent to her house to check her welfare and she was escorted to the ED. Pt reports symptoms including crying spells, poor sleep, decreased appetite and feelings of hopelessness and being overwhelmed. She continues to verbalize suicidal ideation but doesn't have a specific plan yet. She states she had a close friend commit suicide which has made her thinking of committing suicide herself. She denies any history of suicidal gestures. Pt denies any alcohol or substance use.   Pt reports she was diagnosed with bipolar disorder by Dr. Milagros Loll "years ago." She is currently prescribed Zoloft 100 mg daily which is prescribed by her primary care physician, Kelton Pillar. She denies any other current outpatient mental health treatment. She denies any history of inpatient mental health treatment. Pt is dressed in a hospital gown, well groomed, alert and oriented x4. She presents with normal speech and normal motor behavior. Eye contact is good. Her thought process is coherent and goal directed. There is no evidence Pt is responding to internal stimuli or experiencing delusional thought content. He memory and concentration appear within normal limits. Pt's mood is depressed and affect is congruent with mood. Pt was calm and cooperative throughout assessment. She is willing to sign voluntarily into inpatient psychiatric treatment.  During admission assessment,  pt rates anxiety and depression both at 3/10. Pt is minimizing and states that this is mostly situational and that she is currently distracted in this environment. Pt reports that her main trigger is her current divorce settlement and that she does not see an answer to her current problems. Pt reports that her "divorce support group" caused more stress because people there are latching onto her.  Pt denies current SI, HI, and AVH, contracts for safety. Pt reports that she never had a specific plan, but feels very overwhelmed with her situation and is having trouble coping. Pt reports good sleep and appetite as well.    Elements:  Location:  Generalized, inpatient [facility]. Quality:  Stable, Improving. Severity:  Severe. Timing:  Constant. Duration:  [place] x years with recent situational exacerbation. Context:  Divorce settlement and fear of losing half of her assets is a major stressor. . Associated Signs/Synptoms: Depression Symptoms:  depressed mood, anhedonia, psychomotor retardation, difficulty concentrating, impaired memory, recurrent thoughts of death, suicidal thoughts without plan, anxiety, loss of energy/fatigue, (Hypo) Manic Symptoms:  N/A Anxiety Symptoms:  Excessive Worry, Psychotic Symptoms:  N/A PTSD Symptoms: N/A Total Time spent with patient: Greater than 30 minutes  Psychiatric Specialty Exam: Physical Exam  Review of Systems  Constitutional: Negative.   HENT: Negative.   Eyes: Negative.   Respiratory: Negative.   Cardiovascular: Negative.   Gastrointestinal: Negative.   Genitourinary: Negative.   Musculoskeletal: Negative.   Skin: Negative.   Neurological: Negative.   Endo/Heme/Allergies: Negative.   Psychiatric/Behavioral: Positive for depression (3/10). The patient is nervous/anxious (3/10).     Blood pressure 98/67, pulse 72, temperature 97.7 F (36.5 C), temperature source Oral, resp. rate 16, height '5\' 2"'  (1.575 m), weight  66.679 kg (147 lb), SpO2  92.00%.Body mass index is 26.88 kg/(m^2).  General Appearance: Casual  Eye Contact::  Good  Speech:  Clear and Coherent  Volume:  Normal  Mood:  Depressed  Affect:  Congruent  Thought Process:  Circumstantial and Coherent  Orientation:  Full (Time, Place, and Person)  Thought Content:  WDL  Suicidal Thoughts:  No  Homicidal Thoughts:  No  Memory:  Immediate;   Fair Recent;   Fair Remote;   Fair  Judgement:  Fair  Insight:  Good  Psychomotor Activity:  Decreased  Concentration:  Good  Recall:  [employer] of Knowledge:Good  Language: Good  Akathisia:  NA  Handed:  Right  AIMS (if indicated):     Assets:  Communication Skills Desire for Improvement Financial Resources/Insurance Housing Leisure Time Physical Health Resilience Talents/Skills Transportation Vocational/Educational  Sleep:  Number of Hours: 3.5    Musculoskeletal: Strength & Muscle Tone: within normal limits Gait & Station: normal Patient leans: N/A  Past Psychiatric History: Diagnosis: Bipolar, Depressed with SI with plan  Hospitalizations: Denies  Outpatient Care: Dr. Milagros Loll (years ago)  Substance Abuse Care: Denies  Self-Mutilation: Denies  Suicidal Attempts: Denies  Violent Behaviors: Denies   Past Medical History:   Past Medical History  Diagnosis Date  . Depression   . Hyperlipidemia   . Renal calculi     RIGHT  . Mild acid reflux   . Frequency of urination   . Urgency of urination   . Hematuria   . Arthritis    None. Allergies:   Allergies  Allergen Reactions  . Statins Other (See Comments)    Extremity weakness   PTA Medications: Prescriptions prior to admission  Medication Sig Dispense Refill  . ezetimibe (ZETIA) 10 MG tablet Take 10 mg by mouth daily.      . sertraline (ZOLOFT) 100 MG tablet Take 100 mg by mouth daily.        Previous Psychotropic Medications:  Medication/Dose  SEE MAR               Substance Abuse History in the last 12 months:   no  Consequences of Substance Abuse: NA  Social History:  reports that she quit smoking about 34 years ago. Her smoking use included Cigarettes. She has a 5 pack-year smoking history. She has never used smokeless tobacco. She reports that she does not drink alcohol or use illicit drugs. Additional Social History:                      Current Place of Residence:   ([place]) Place of Birth:  [place] Family Members: Daughter, no more family members Marital Status:  In process of divorce Children:  Sons:   Daughters: daughter  Relationships: In process of divorce Education:  Masters Electrical engineer) Educational Problems/Performance: Denies Religious Beliefs/Practices: Christian  History of Abuse (Emotional/Phsycial/Sexual):  Occupational Experiences; Engineer, maintenance (IT), Armed forces training and education officer, Armed forces training and education officer History: Denies Legal History: Denies Hobbies/Interests: Reading, TV   Family History:  History reviewed. No pertinent family history.  Results for orders placed during the hospital encounter of 06/01/13 (from the past 72 hour(s))  ACETAMINOPHEN LEVEL     Status: None   Collection Time    06/01/13  5:51 PM      Result Value Ref Range   Acetaminophen (Tylenol), Serum <15.0  10 - 30 ug/mL   Comment:            THERAPEUTIC CONCENTRATIONS VARY  SIGNIFICANTLY. A RANGE OF 10-30     ug/mL MAY BE AN EFFECTIVE     CONCENTRATION FOR MANY PATIENTS.     HOWEVER, SOME ARE BEST TREATED     AT CONCENTRATIONS OUTSIDE THIS     RANGE.     ACETAMINOPHEN CONCENTRATIONS     >150 ug/mL AT 4 HOURS AFTER     INGESTION AND >50 ug/mL AT 12     HOURS AFTER INGESTION ARE     OFTEN ASSOCIATED WITH TOXIC     REACTIONS.  CBC     Status: Abnormal   Collection Time    06/01/13  5:51 PM      Result Value Ref Range   WBC 7.8  4.0 - 10.5 K/uL   RBC 3.88  3.87 - 5.11 MIL/uL   Hemoglobin 10.8 (*) 12.0 - 15.0 g/dL   HCT 33.0 (*) 36.0 - 46.0 %   MCV 85.1  78.0 - 100.0 fL   MCH 27.8  26.0 - 34.0  pg   MCHC 32.7  30.0 - 36.0 g/dL   RDW 15.8 (*) 11.5 - 15.5 %   Platelets 364  150 - 400 K/uL  COMPREHENSIVE METABOLIC PANEL     Status: Abnormal   Collection Time    06/01/13  5:51 PM      Result Value Ref Range   Sodium 143  137 - 147 mEq/L   Potassium 4.2  3.7 - 5.3 mEq/L   Chloride 103  96 - 112 mEq/L   CO2 25  19 - 32 mEq/L   Glucose, Bld 89  70 - 99 mg/dL   BUN 16  6 - 23 mg/dL   Creatinine, Ser 0.66  0.50 - 1.10 mg/dL   Calcium 10.2  8.4 - 10.5 mg/dL   Total Protein 7.5  6.0 - 8.3 g/dL   Albumin 4.1  3.5 - 5.2 g/dL   AST 15  0 - 37 U/L   ALT 13  0 - 35 U/L   Alkaline Phosphatase 78  39 - 117 U/L   Total Bilirubin 0.2 (*) 0.3 - 1.2 mg/dL   GFR calc non Af Amer 89 (*) >90 mL/min   GFR calc Af Amer >90  >90 mL/min   Comment: (NOTE)     The eGFR has been calculated using the CKD EPI equation.     This calculation has not been validated in all clinical situations.     eGFR's persistently <90 mL/min signify possible Chronic Kidney     Disease.  ETHANOL     Status: None   Collection Time    06/01/13  5:51 PM      Result Value Ref Range   Alcohol, Ethyl (B) <11  0 - 11 mg/dL   Comment:            LOWEST DETECTABLE LIMIT FOR     SERUM ALCOHOL IS 11 mg/dL     FOR MEDICAL PURPOSES ONLY  SALICYLATE LEVEL     Status: Abnormal   Collection Time    06/01/13  5:51 PM      Result Value Ref Range   Salicylate Lvl <0.2 (*) 2.8 - 20.0 mg/dL  URINE RAPID DRUG SCREEN (HOSP PERFORMED)     Status: None   Collection Time    06/01/13  6:50 PM      Result Value Ref Range   Opiates NONE DETECTED  NONE DETECTED   Cocaine NONE DETECTED  NONE DETECTED   Benzodiazepines NONE DETECTED  NONE DETECTED   Amphetamines NONE DETECTED  NONE DETECTED   Tetrahydrocannabinol NONE DETECTED  NONE DETECTED   Barbiturates NONE DETECTED  NONE DETECTED   Comment:            DRUG SCREEN FOR MEDICAL PURPOSES     ONLY.  IF CONFIRMATION IS NEEDED     FOR ANY PURPOSE, NOTIFY LAB     WITHIN 5 DAYS.                 LOWEST DETECTABLE LIMITS     FOR URINE DRUG SCREEN     Drug Class       Cutoff (ng/mL)     Amphetamine      1000     Barbiturate      200     Benzodiazepine   416     Tricyclics       606     Opiates          300     Cocaine          300     THC              50   Psychological Evaluations:  Assessment:   DSM5:   Depressive Disorders:  Major Depressive Disorder - Severe (296.23)  AXIS I:  Bipolar, Depressed AXIS II:  Deferred AXIS III:   Past Medical History  Diagnosis Date  . Depression   . Hyperlipidemia   . Renal calculi     RIGHT  . Mild acid reflux   . Frequency of urination   . Urgency of urination   . Hematuria   . Arthritis    AXIS IV:  economic problems, other psychosocial or environmental problems, problems related to legal system/crime and problems related to social environment AXIS V:  41-50 serious symptoms  Treatment Plan/Recommendations:   Review of chart, vital signs, medications, and notes.  1-Individual and group therapy  2-Medication management for depression and anxiety: Medications reviewed with the patient and she stated no untoward effects, unchanged. 3-Coping skills for depression, anxiety  4-Continue crisis stabilization and management  5-Address health issues--monitoring vital signs, stable  6-Treatment plan in progress to prevent relapse of depression and anxiety  Treatment Plan Summary: Daily contact with patient to assess and evaluate symptoms and progress in treatment Medication management Current Medications:  Current Facility-Administered Medications  Medication Dose Route Frequency Provider Last Rate Last Dose  . acetaminophen (TYLENOL) tablet 650 mg  650 mg Oral Q6H PRN Lurena Nida, NP      . alum & mag hydroxide-simeth (MAALOX/MYLANTA) 200-200-20 MG/5ML suspension 30 mL  30 mL Oral Q4H PRN Lurena Nida, NP      . ezetimibe (ZETIA) tablet 10 mg  10 mg Oral Daily Lurena Nida, NP   10 mg at 06/02/13 0936  . hydrOXYzine  (ATARAX/VISTARIL) tablet 50 mg  50 mg Oral QHS PRN Lurena Nida, NP   50 mg at 06/02/13 0054  . magnesium hydroxide (MILK OF MAGNESIA) suspension 30 mL  30 mL Oral Daily PRN Lurena Nida, NP      . sertraline (ZOLOFT) tablet 100 mg  100 mg Oral Daily Lurena Nida, NP   100 mg at 06/02/13 3016    Observation Level/Precautions:  15 minute checks  Laboratory:  Labs resulted, reviewed, and stable at this time.   Psychotherapy:  Group therapy, individual therapy, psychoeducation  Medications:  See MAR above  Consultations: None  Discharge Concerns: None    Estimated LOS: 5-7 days  Other:  N/A    I certify that inpatient services furnished can reasonably be expected to improve the patient's condition.   Benjamine Mola, FNP-BC 4/11/201510:14 AM  Patient seen, evaluated and I agree with notes by Nurse Practitioner. Corena Pilgrim, MD

## 2013-06-02 NOTE — BHH Counselor (Signed)
Adult Comprehensive Assessment  Patient ID: Glenda Klein, female   DOB: Feb 24, 1945, 69 y.o.   MRN: [id number]  Information Source: Information source: Patient  Current Stressors:  Educational / Learning stressors: None reported Employment / Job issues: None reported Family Relationships: going through a divorce Museum/gallery curator / Lack of resources (include bankruptcy): concern with her spouse getting half of her finances Housing / Lack of housing: none reported Physical health (include injuries & life threatening diseases): None reported  Social relationships: Pt reports concern of not having many friends Substance abuse: None reported Bereavement / Loss: None reported  Living/Environment/Situation:  Living Arrangements: Alone Living conditions (as described by patient or guardian): stable How long has patient lived in current situation?: 20 years What is atmosphere in current home: Comfortable  Family History:  Marital status: Separated Separated, when?: one year ago What types of issues is patient dealing with in the relationship?: Pt reports her husband was having an affair for 10 years that she just found out about and recently seperated Does patient have children?: Yes How many children?: 1 How is patient's relationship with their children?: very supportive  Childhood History:  By whom was/is the patient raised?: Both parents Description of patient's relationship with caregiver when they were a child: good relationship with parents Patient's description of current relationship with people who raised him/her: deceased Does patient have siblings?: No Did patient suffer any verbal/emotional/physical/sexual abuse as a child?: No Did patient suffer from severe childhood neglect?: No Has patient ever been sexually abused/assaulted/raped as an adolescent or adult?: No Was the patient ever a victim of a crime or a disaster?: No Witnessed domestic violence?: No Has patient been  effected by domestic violence as an adult?: No  Education:  Highest grade of school patient has completed: Masters degree Currently a Ship broker?: No Learning disability?: No  Employment/Work Situation:   Employment situation: Employed Where is patient currently employed?: [employer] long has patient been employed?: 15 years Patient's job has been impacted by current illness: No What is the longest time patient has a held a job?: 15 years  Where was the patient employed at that time?: [employer] Has patient ever been in the [employer]?: No Has patient ever served in Recruitment consultant?: No  Financial Resources:   Financial resources: Income from employment Does patient have a representative payee or guardian?: No  Alcohol/Substance Abuse:   What has been your use of drugs/alcohol within the last 12 months?: None reported If attempted suicide, did drugs/alcohol play a role in this?: No Alcohol/Substance Abuse Treatment Hx: Denies past history Has alcohol/substance abuse ever caused legal problems?: No  Social Support System:   Pensions consultant Support System: Fair Describe Community Support System: Pt reports being in support groups  Type of faith/religion: Christian How does patient's faith help to cope with current illness?: "yes"  Leisure/Recreation:   Leisure and Hobbies: read  Strengths/Needs:   What things does the patient do well?: born leader In what areas does patient struggle / problems for patient: mood control, taking the blame for things   Discharge Plan:   Does patient have access to transportation?: Yes Will patient be returning to same living situation after discharge?: Yes Currently receiving community mental health services: No If no, would patient like referral for services when discharged?: Yes (What county[place] Sports coach) Does patient have financial barriers related to discharge medications?: No  Summary/Recommendations:   Summary and Recommendations (to be  completed by the evaluator): Pt would benefit from acute stay  due to SI.  Pt was admitted from law enforcement due to calling a suicidal hotline and them sending someone to her home.  Pt reports she wants more community and formal supports.   Glenda Klein. 06/02/2013

## 2013-06-02 NOTE — Progress Notes (Signed)
.  Psychoeducational Group Note    Date: 06/02/2013 Time:  0930   Goal Setting Purpose of Group: To be able to set a goal that is measurable and that can be accomplished in one day Participation Level:  Active  Participation Quality:  Appropriate  Affect:  Appropriate  Cognitive:  Appropriate  Insight:  Engaged and Improving  Engagement in Group:  Engaged  Additional Comments:  Pt attended the group and partisipated  Rebbecca Osuna A  

## 2013-06-02 NOTE — Progress Notes (Signed)
The focus of this group is to help patients review their daily goal of treatment and discuss progress on daily workbooks. Pt attended the evening group session and responded to all discussion prompts from the [place]. Pt reported having a good day on the unit but was concerned about receiving medication for sleep this evening. The Writer encouraged the Pt to see her RN following group about her nighttime medications. Pt did not have any additional requests from Nursing Staff this evening. Pt's affect was appropriate.

## 2013-06-02 NOTE — B[facility] Group Notes (Signed)
[place] LCSW Group Therapy  06/02/2013 3:53 PM  Type of Therapy:  Group Therapy  Participation Level:  Active  Participation Quality:  Appropriate, Sharing and Supportive  Affect:  Appropriate  Cognitive:  Alert and Oriented  Insight:  Developing/Improving, Engaged and Supportive  Engagement in Therapy:  Developing/Improving, Engaged and Supportive  Modes of Intervention:  Discussion, Education, Exploration, Rapport Building and Support  Summary of Progress/Problems: Pt was engaged and was able to identify a positive support and coping skill.  Pt was supportive or other group members sharing and was able to disclose personal examples.   [name] 06/02/2013, 3:53 PM

## 2013-06-02 NOTE — Progress Notes (Signed)
Psychoeducational Group Note  Date: 06/02/2013 Time:  1015  Group Topic/Focus:  Identifying Needs:   The focus of this group is to help patients identify their personal needs that have been historically problematic and identify healthy behaviors to address their needs.  Participation Level:  Active  Participation Quality:  Appropriate  Affect:  Appropriate  Cognitive:  Oriented  Insight:  Improving  Engagement in Group:  Engaged  Additional Comments:  Pt attended the group and participated in the discussion  Ludene Stokke A  

## 2013-06-03 DIAGNOSIS — F332 Major depressive disorder, recurrent severe without psychotic features: Secondary | ICD-10-CM

## 2013-06-03 NOTE — Progress Notes (Signed)
Writer spoke with patient 1:1 and she reports that her day has been good and she spoke with some friends on the phone today but no visitors. She is compliant with her medications and is concerned that her visteril for sleep may be to high of a dosage b/c she felt groggy the first part of the day. Writer suggested that in the am if she feels this way again to let writer know so that the doctor can be made aware of this. She currently denies si/hi/a/v hallucinations. Safety maintained on unit with 15 min checks.

## 2013-06-03 NOTE — Progress Notes (Signed)
[facility] MD Progress Note  06/03/2013 4:10 PM Glenda Klein  MRN:  [id number] Subjective:  Upon today's assessment she is very cheerful. She has been compliant with her medication regimen and actively participating on the unit activities and states that she is learning coping skills. She has no reported adverse effects of medications. Patient agreed with the current medication management and tolerating well. She denies SI/HI, AVH at this time. Currently rates her depression at 2-3/10, and anxiety 2-3/10. Upon discharge she plans to go home. She states she doesn't have a support system"period". She has learned that she needs to break herself away from these people who are relying on her.  Diagnosis:   DSM5: Schizophrenia Disorders:   Obsessive-Compulsive Disorders:   Trauma-Stressor Disorders:   Substance/Addictive Disorders:   Depressive Disorders:  Major Depressive Disorder - Severe (296.23) Total Time spent with patient: 30 minutes  Axis I: Major Depression, Recurrent severe Axis II: Deferred Axis IV: other psychosocial or environmental problems, problems related to legal system/crime, problems related to social environment, problems with access to health care services and problems with primary support group Axis V: 41-50 serious symptoms  ADL's:  Intact  Sleep: Fair  Appetite:  Fair  Suicidal Ideation:  Plan:  Denies Intent:  Denies Means:  Denies Homicidal Ideation:  Plan:  Denies Intent:  Denies Means:  Denies AEB (as evidenced by):  Psychiatric Specialty Exam: Physical Exam  Constitutional: She is oriented to person, place, and time. She appears well-developed.  HENT:  Head: Normocephalic.  Eyes: Pupils are equal, round, and reactive to light.  Neck: Normal range of motion.  Respiratory: Effort normal.  GI: Soft. Bowel sounds are normal.  Musculoskeletal: Normal range of motion.  Neurological: She is alert and oriented to person, place, and time.  Skin: Skin is warm  and dry.  Psychiatric: She has a normal mood and affect. Her behavior is normal. Judgment and thought content normal.    Review of Systems  Psychiatric/Behavioral: Positive for depression. The patient is nervous/anxious.   All other systems reviewed and are negative.   Blood pressure 108/73, pulse 85, temperature 97.9 F (36.6 C), temperature source Oral, resp. rate 16, height '5\' 2"'  (1.575 m), weight 66.679 kg (147 lb), SpO2 92.00%.Body mass index is 26.88 kg/(m^2).  General Appearance: Casual and Fairly Groomed  Engineer, water::  Good  Speech:  Clear and Coherent and Normal Rate  Volume:  Normal  Mood:  Euthymic  Affect:  Appropriate  Thought Process:  Goal Directed and Intact  Orientation:  Full (Time, Place, and Person)  Thought Content:  WDL  Suicidal Thoughts:  No  Homicidal Thoughts:  No  Memory:  Immediate;   Good Recent;   Good Remote;   Good  Judgement:  Fair  Insight:  Fair  Psychomotor Activity:  Normal  Concentration:  Good  Recall:  Good  Fund of Knowledge:Fair  Language: Good  Akathisia:  No  Handed:  Right  AIMS (if indicated):     Assets:  Communication Skills Desire for Improvement Financial Resources/Insurance Housing  Sleep:  Number of Hours: 6.75   Musculoskeletal: Strength & Muscle Tone: within normal limits Gait & Station: normal Patient leans: N/A  Current Medications: Current Facility-Administered Medications  Medication Dose Route Frequency Provider Last Rate Last Dose  . acetaminophen (TYLENOL) tablet 650 mg  650 mg Oral Q6H PRN Lurena Nida, NP   650 mg at 06/02/13 1443  . alum & mag hydroxide-simeth (MAALOX/MYLANTA) 200-200-20 MG/5ML suspension 30 mL  30 mL Oral Q4H PRN Lurena Nida, NP      . ezetimibe (ZETIA) tablet 10 mg  10 mg Oral Daily Lurena Nida, NP   10 mg at 06/03/13 0903  . hydrOXYzine (ATARAX/VISTARIL) tablet 50 mg  50 mg Oral QHS PRN Lurena Nida, NP   50 mg at 06/02/13 2141  . magnesium hydroxide (MILK OF MAGNESIA)  suspension 30 mL  30 mL Oral Daily PRN Lurena Nida, NP      . sertraline (ZOLOFT) tablet 100 mg  100 mg Oral Daily Lurena Nida, NP   100 mg at 06/03/13 6010    Lab Results:  Results for orders placed during the hospital encounter of 06/01/13 (from the past 48 hour(s))  ACETAMINOPHEN LEVEL     Status: None   Collection Time    06/01/13  5:51 PM      Result Value Ref Range   Acetaminophen (Tylenol), Serum <15.0  10 - 30 ug/mL   Comment:            THERAPEUTIC CONCENTRATIONS VARY     SIGNIFICANTLY. A RANGE OF 10-30     ug/mL MAY BE AN EFFECTIVE     CONCENTRATION FOR MANY PATIENTS.     HOWEVER, SOME ARE BEST TREATED     AT CONCENTRATIONS OUTSIDE THIS     RANGE.     ACETAMINOPHEN CONCENTRATIONS     >150 ug/mL AT 4 HOURS AFTER     INGESTION AND >50 ug/mL AT 12     HOURS AFTER INGESTION ARE     OFTEN ASSOCIATED WITH TOXIC     REACTIONS.  CBC     Status: Abnormal   Collection Time    06/01/13  5:51 PM      Result Value Ref Range   WBC 7.8  4.0 - 10.5 K/uL   RBC 3.88  3.87 - 5.11 MIL/uL   Hemoglobin 10.8 (*) 12.0 - 15.0 g/dL   HCT 33.0 (*) 36.0 - 46.0 %   MCV 85.1  78.0 - 100.0 fL   MCH 27.8  26.0 - 34.0 pg   MCHC 32.7  30.0 - 36.0 g/dL   RDW 15.8 (*) 11.5 - 15.5 %   Platelets 364  150 - 400 K/uL  COMPREHENSIVE METABOLIC PANEL     Status: Abnormal   Collection Time    06/01/13  5:51 PM      Result Value Ref Range   Sodium 143  137 - 147 mEq/L   Potassium 4.2  3.7 - 5.3 mEq/L   Chloride 103  96 - 112 mEq/L   CO2 25  19 - 32 mEq/L   Glucose, Bld 89  70 - 99 mg/dL   BUN 16  6 - 23 mg/dL   Creatinine, Ser 0.66  0.50 - 1.10 mg/dL   Calcium 10.2  8.4 - 10.5 mg/dL   Total Protein 7.5  6.0 - 8.3 g/dL   Albumin 4.1  3.5 - 5.2 g/dL   AST 15  0 - 37 U/L   ALT 13  0 - 35 U/L   Alkaline Phosphatase 78  39 - 117 U/L   Total Bilirubin 0.2 (*) 0.3 - 1.2 mg/dL   GFR calc non Af Amer 89 (*) >90 mL/min   GFR calc Af Amer >90  >90 mL/min   Comment: (NOTE)     The eGFR has been  calculated using the CKD EPI equation.     This calculation has not been  validated in all clinical situations.     eGFR's persistently <90 mL/min signify possible Chronic Kidney     Disease.  ETHANOL     Status: None   Collection Time    06/01/13  5:51 PM      Result Value Ref Range   Alcohol, Ethyl (B) <11  0 - 11 mg/dL   Comment:            LOWEST DETECTABLE LIMIT FOR     SERUM ALCOHOL IS 11 mg/dL     FOR MEDICAL PURPOSES ONLY  SALICYLATE LEVEL     Status: Abnormal   Collection Time    06/01/13  5:51 PM      Result Value Ref Range   Salicylate Lvl <9.1 (*) 2.8 - 20.0 mg/dL  URINE RAPID DRUG SCREEN (HOSP PERFORMED)     Status: None   Collection Time    06/01/13  6:50 PM      Result Value Ref Range   Opiates NONE DETECTED  NONE DETECTED   Cocaine NONE DETECTED  NONE DETECTED   Benzodiazepines NONE DETECTED  NONE DETECTED   Amphetamines NONE DETECTED  NONE DETECTED   Tetrahydrocannabinol NONE DETECTED  NONE DETECTED   Barbiturates NONE DETECTED  NONE DETECTED   Comment:            DRUG SCREEN FOR MEDICAL PURPOSES     ONLY.  IF CONFIRMATION IS NEEDED     FOR ANY PURPOSE, NOTIFY LAB     WITHIN 5 DAYS.                LOWEST DETECTABLE LIMITS     FOR URINE DRUG SCREEN     Drug Class       Cutoff (ng/mL)     Amphetamine      1000     Barbiturate      200     Benzodiazepine   791     Tricyclics       505     Opiates          300     Cocaine          300     THC              50    Physical Findings: AIMS: Facial and Oral Movements Muscles of Facial Expression: None, normal Lips and Perioral Area: None, normal Jaw: None, normal Tongue: None, normal,Extremity Movements Upper (arms, wrists, hands, fingers): None, normal Lower (legs, knees, ankles, toes): None, normal, Trunk Movements Neck, shoulders, hips: None, normal, Overall Severity Severity of abnormal movements (highest score from questions above): None, normal Incapacitation due to abnormal movements: None,  normal Patient's awareness of abnormal movements (rate only patient's report): No Awareness, Dental Status Current problems with teeth and/or dentures?: No Does patient usually wear dentures?: No  CIWA:    COWS:     Treatment Plan Summary: Daily contact with patient to assess and evaluate symptoms and progress in treatment Medication management  Plan: Treatment Plan/Recommendations:  1 Admit for crisis management and stabilization. Estimated length of stay 5-7 days past her current stay of 1.  2 Individual and group therapy. 3 Medication management for depression, and anxiety to reduce current symptoms to base line and improve the overall levels of functioning: Medications reviewed with the patient and she stated no untoward effects, home medications in place.  4 Coping skills for depression and anxiety developing.  5 Continue crisis stabilization and management.  6 Address health issues- monitor vital signs, stable;  7 Treatment plan in progress to prevent relapse prevention and self care.  8 Psychosocial education regarding relapse prevention and self care 9 Heath care follow up as needed for any health concerns 10 Call for consult with hospitalist for additional specialty patient services as needed.   Medical Decision Making Problem Points:  Established problem, worsening (2), Review of last therapy session (1) and Review of psycho-social stressors (1) Data Points:  Review or order medicine tests (1) Review and summation of old records (2) Review of medication regiment & side effects (2) Review of new medications or change in dosage (2)  I certify that inpatient services furnished can reasonably be expected to improve the patient's condition.   Gayland Curry Starkes FNP-BC 06/03/2013, 4:10 PM

## 2013-06-03 NOTE — BHH Group Notes (Addendum)
  [place] LCSW Group Therapy No    06/03/2013 1:15PM  Type of Therapy and Topic:  Group Therapy: Stages of Change and Self-Sabotaging, Enabling Behaviors   Participation Level:  Active   Mood: Appropriate  Description of Group:     Learn how to identify obstacles, self-sabotaging and enabling behaviors, what are they, why do we do them and what needs do these behaviors meet? Discuss unhealthy relationships and how to have positive healthy boundaries with those that sabotage and enable. Explore aspects of self-sabotage and enabling in yourself and how to limit these self-destructive behaviors in everyday life.  Therapeutic Goals: 1. Patient will identify one obstacle that relates to self-sabotage and enabling behaviors 2. Patient will identify one personal self-sabotaging or enabling behavior they did prior to admission 3. Patient will demonstrate ability to communicate their needs through discussion and/or role plays. 4. Patient will identify where they are in Stages of Change Diagram   Summary of Patient Progress: The main focus of today's process group was to have patient identify with what "self-sabotage" means to them and use Motivational Interviewing to discuss what benefits, negative or positive, were involved in a self-identified self-sabotaging behavior. We then talked about reasons the patient may want to change the behavior and any current desire to change. Patients then had opportunity to identify where they are on the Stages of Change Cycle diagram discussed in group. Risa identified her goal of selling her home and relocating out of [place]. She identified her lack of supports and tendency to stay in non supportive relationships as room for growth and feels ready to prepare for change.    Therapeutic Modalities:   Cognitive Behavioral Therapy Person-Centered Therapy Motivational Interviewing   Sheilah Pigeon, LCSW

## 2013-06-03 NOTE — Progress Notes (Signed)
Writer spoke with patient 1:1 and she reports having had a good day. She reports that her daughter had been trying to locate her and flew in from [place]. Patient did not appear to be very concerned with the fact that her daughter flew into town in order to locate her. She reports that her problem is situational and once every thing from her separation is final then she can move on. Writer encouraged her to find a good support person or group. Patient was receptive. Safety maintained on unit with 15 min checks.

## 2013-06-03 NOTE — Progress Notes (Signed)
Psychoeducational Group Note  Date:  06/03/2013 Time:  1015  Group Topic/Focus:  Making Healthy Choices:   The focus of this group is to help patients identify negative/unhealthy choices they were using prior to admission and identify positive/healthier coping strategies to replace them upon discharge.  Participation Level:  Active  Participation Quality:  Appropriate  Affect:  Flat  Cognitive:  Oriented  Insight:  Improving  Engagement in Group:  Engaged  Additional Comments:  Added a lot to the conversation and listened to others when they shared  Paulino Rily 06/03/2013

## 2013-06-03 NOTE — Progress Notes (Addendum)
Glenda Klein remains pleasant and cooperative today. She jokes with her peers frequently. She completes her self inventory and on it she writes she denies SI  Within the previous 24 hrs and she says her hopelessness is a " 6 " and that she needs " help with this .." , when responding to question asked  about what her  plans are for DC.    A She attends groups as planned and she takes her meds as scheduled.    R Safety is in place and poc cont.

## 2013-06-03 NOTE — Progress Notes (Signed)
Psychoeducational Group Note  Date:  06/03/2013 Time:  0930Group Topic/Focus:  Making Healthy Choices:   The focus of this group is to help patients identify negative/unhealthy choices they were using prior to admission and identify positive/healthier coping strategies to replace them upon discharge.  Participation Level:  Active  Participation Quality:  Appropriate  Affect:  Appropriate  Cognitive:  Appropriate  Insight:  Engaged  Engagement in Group:  Engaged  Additional Comments:    Trixie Rude Laure Leone 7:36 PM. 06/03/2013

## 2013-06-04 MED ORDER — MIRTAZAPINE 7.5 MG PO TABS
7.5000 mg | ORAL_TABLET | Freq: Every day | ORAL | Status: DC
  Administered 2013-06-04: 7.5 mg via ORAL
  Filled 2013-06-04: qty 1
  Filled 2013-06-04: qty 7
  Filled 2013-06-04: qty 1

## 2013-06-04 MED ORDER — CLONAZEPAM 0.5 MG PO TABS
0.2500 mg | ORAL_TABLET | Freq: Two times a day (BID) | ORAL | Status: DC
  Administered 2013-06-04 – 2013-06-05 (×2): 0.25 mg via ORAL
  Filled 2013-06-04 (×2): qty 1

## 2013-06-04 NOTE — Progress Notes (Signed)
Pt observed sitting in the dayroom earlier watching TV with minimal interaction with peers.  Pt attended evening group.  Pt reports she had a disappointing day.  She is frustrated that her husband, who had an affair and left her, will be able to get half their assets.  She says she was the primary provider and it is not fair that he should get half their assets.  Allowed pt to vent.  Encouraged her to share her concerns and options with her attorney.  Pt denies SI/HI/AV at this time.  Pt was encouraged to make her needs known to staff.  Support and encouragement offered.  Safety maintained with q15 minute checks.

## 2013-06-04 NOTE — Progress Notes (Addendum)
D:  Patient's self inventory sheet, patient has fair sleep, good appetite, normal energy level, good attention span.  Rated depression 6, hopeless 3, anxiety 2.  Denied withdrawals.  Denied SI.  Denied physical problems.  Worst pain #3.  Plans to return home.  No problems with meds after discharge.  A:  Medications administered per MD orders.  Emotional support and encouragement given patient. R:  Denied SI.  HI, contracts for safety, HI to husband and laughed.  Denied A/V hallucinations.  Will continue to monitor patient for safety with 15 minute checks.s  Safety maintained.

## 2013-06-04 NOTE — Progress Notes (Signed)
Adult Psychoeducational Group Note  Date:  06/04/2013 Time:  9:51 PM  Group Topic/Focus:  Goals Group:   The focus of this group is to help patients establish daily goals to achieve during treatment and discuss how the patient can incorporate goal setting into their daily lives to aide in recovery.  Participation Level:  Active  Participation Quality:  Appropriate  Affect:  Appropriate  Cognitive:  Appropriate  Insight: Appropriate  Engagement in Group:  Engaged  Modes of Intervention:  Discussion  Additional Comments:  Pt stated that she felt like here day was wasted. Pt stated that she didn't really get a lot out of the classes and groups today.   [name] 06/04/2013, 9:51 PM

## 2013-06-04 NOTE — B[facility] Group Notes (Signed)
[place] LCSW Group Therapy          Overcoming Obstacles       1:15 -2:30        06/04/2013       Type of Therapy:  Group Therapy  Participation Level:  Appropriate  Participation Quality:  Appropriate  Affect:  Appropriate, Alert  Cognitive:  Attentive Appropriate  Insight: Developing/Improving Engaged  Engagement in Therapy: Developing/Imprvoing Engaged  Modes of Intervention:  Discussion Exploration  Education Rapport BuildingProblem-Solving Support  Summary of Progress/Problems:  The main focus of today's group was overcoming obstacles. Patient listened attentively but did not engage in discussion.                         Concha Pyo 06/04/2013

## 2013-06-04 NOTE — BHH Group Notes (Signed)
[facility] LCSW Aftercare Discharge Planning Group Note   06/04/2013 3:23 PM    Participation Quality:  Appropraite  Mood/Affect:  Appropriate  Depression Rating:  2  Anxiety Rating:  2  Thoughts of Suicide:  No  Will you contract for safety?   NA  Current AVH:  No  Plan for Discharge/Comments:  Patient attended discharge planning group and actively participated in group.  Patient will need assistance with outpatient services. CSW provided all participants with daily workbook.   Transportation Means: Patient has transportation.   Supports:  Patient has a support system.   Yeison Sippel, Eulas Post

## 2013-06-04 NOTE — Tx Team (Signed)
Interdisciplinary Treatment Plan Update   Date Reviewed:  06/04/2013  Time Reviewed:  9:51 AM  Progress in Treatment:   Attending groups: Yes Participating in groups: Yes Taking medication as prescribed: Yes  Tolerating medication: Yes Family/Significant other contact made:  No, but will ask patient for consent for collateral contact Patient understands diagnosis: Yes  Discussing patient identified problems/goals with staff: Yes Medical problems stabilized or resolved: Yes Denies suicidal/homicidal ideation: Yes Patient has not harmed self or others: Yes  For review of initial/current patient goals, please see plan of care.  Estimated Length of Stay:  1-2 days  Reasons for Continued Hospitalization:  Anxiety Depression Medication stabilization   New Problems/Goals identified:    Discharge Plan or Barriers:   Home with outpatient follow up to be determined   Additional Comments:  Glenda Klein is an 69 y.o. female, separated, Caucasian who presents to [facility] ED voluntarily accompanied by Event organiser. Pt reports she has a history of bipolar disorder and has recently become increasingly depressed. Pt reports feeling suicidal tonight and was searching online for methods to commit suicide. She called a suicide hotline and law enforcement was sent to her house to check her welfare and she was escorted to the ED. Pt reports symptoms including crying spells, poor sleep, decreased appetite and feelings of hopelessness and being overwhelmed. She continues to verbalize suicidal ideation but doesn't have a specific plan yet. She states she had a close friend commit suicide which has made her thinking of committing suicide herself. She denies any history of suicidal gestures. Pt denies any alcohol or substance use.    Attendees:  Patient:  06/04/2013 9:51 AM   Signature: Mylinda Latina, MD 06/04/2013 9:51 AM  Signature:  Nena Polio, PA 06/04/2013 9:51 AM  Signature:   06/04/2013  9:51 AM  Signature:Beverly Danelle Earthly, RN 06/04/2013 9:51 AM  Signature:  Thurnell Garbe RN 06/04/2013 9:51 AM  Signature:  Joette Catching, LCSW 06/04/2013 9:51 AM  Signature:   06/04/2013 9:51 AM  Signature:  Lucinda Dell, Care Coordinator [facility] 06/04/2013 9:51 AM  Signature: Drake Leach, RN 06/04/2013 9:51 AM  Signature: 06/04/2013  9:51 AM  Signature:   Lars Pinks, RN [facility] 06/04/2013  9:51 AM  Signature:   06/04/2013  9:51 AM    Scribe for Treatment Team:   Joette Catching,  06/04/2013 9:51 AM

## 2013-06-04 NOTE — Progress Notes (Signed)
NUTRITION ASSESSMENT  Pt identified as at risk on the Malnutrition Screen Tool  INTERVENTION: 1. Educated patient on the importance of nutrition and encouraged intake of food and beverages. 2. Discussed weight goals. 3. Supplements: none at this time.  NUTRITION DIAGNOSIS: Unintentional weight loss related to sub-optimal intake as evidenced by pt report.   Goal: Pt to meet >/= 90% of their estimated nutrition needs.  Monitor:  PO intake  Assessment:  Patient admitted with depression with SI. Patient reports eating well currently with good appetite but prior to admit, did not always eat well.  Would skip meals.  Weight is stable.  68 y.o. female  Height: Ht Readings from Last 1 Encounters:  06/02/13 5\' 2"  (1.575 m)    Weight: Wt Readings from Last 1 Encounters:  06/02/13 147 lb (66.679 kg)    Weight Hx: Wt Readings from Last 10 Encounters:  06/02/13 147 lb (66.679 kg)  09/21/12 138 lb (62.596 kg)  09/21/12 138 lb (62.596 kg)  08/20/12 143 lb (64.864 kg)  08/20/12 143 lb (64.864 kg)    BMI:  Body mass index is 26.88 kg/(m^2). Pt meets criteria for overweight based on current BMI.  Estimated Nutritional Needs: Kcal: 25-30 kcal/kg Protein: > 1 gram protein/kg Fluid: 1 ml/kcal  Diet Order: Cardiac Pt is also offered choice of unit snacks mid-morning and mid-afternoon.  Pt is eating as desired.   Lab results and medications reviewed.   Antonieta Iba, RD, LDN Clinical Inpatient Dietitian Pager:  [phone] Weekend and after hours pager:  [phone]

## 2013-06-04 NOTE — Progress Notes (Signed)
Patient ID: Glenda Klein, female   DOB: 21-May-1945, 68 y.o.   MRN: [id number] [facility] MD Progress Note  06/04/2013 12:32 PM Glenda Klein  MRN:  [id number]  Subjective:  Patient reported that she was admitted for depression, hopelessness and suicidal ideations since she found out state of [place] law says she has to provide her asset details to the court and that will be shared 50:50 with her ex-husband who spent all his money and having affair and not fair to her after 12 years of marriage. She has daughter in medical school is supportive of her. She was previously diagnosed with bipolar but currently presented with symptoms of depression and suicidal thoughts and searching on Internet prior to be admitted. She has been compliant with her medication regimen and actively participating on the unit activities and states that she is learning coping skills. She has no reported adverse effects of medications. Patient agreed with the current medication management and tolerating well. She denies suicidal ideations and contract for safety while in hospital. She denied homicidal ideation but makes statement of homicidal towards her husband but laughs about it. She has no evidence of psychosis. She has been minimizing her symptoms of depression and anxiety at this time and thinking about participating in IOP program when discharged from the hospital. 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 She has stated that her situation is causing her current symptoms of depression and she needs to learn good coping skills and frustrated with her lawyer and state law about divorce process.   Diagnosis:   DSM5: Schizophrenia Disorders:   Obsessive-Compulsive Disorders:   Trauma-Stressor Disorders:   Substance/Addictive Disorders:   Depressive Disorders:  Major Depressive Disorder - Severe (296.23) Total Time spent with patient: 30 minutes  Axis I: Major Depression,  Recurrent severe Axis II: Deferred Axis IV: other psychosocial or environmental problems, problems related to legal system/crime, problems related to social environment, problems with access to health care services and problems with primary support group Axis V: 41-50 serious symptoms  ADL's:  Intact  Sleep: Fair  Appetite:  Fair  Suicidal Ideation:  Plan:  Denies Intent:  Denies Means:  Denies Homicidal Ideation:  Plan:  Denies Intent:  Denies Means:  Denies AEB (as evidenced by):  Psychiatric Specialty Exam: Physical Exam  Constitutional: She is oriented to person, place, and time. She appears well-developed.  HENT:  Head: Normocephalic.  Eyes: Pupils are equal, round, and reactive to light.  Neck: Normal range of motion.  Respiratory: Effort normal.  GI: Soft. Bowel sounds are normal.  Musculoskeletal: Normal range of motion.  Neurological: She is alert and oriented to person, place, and time.  Skin: Skin is warm and dry.  Psychiatric: She has a normal mood and affect. Her behavior is normal. Judgment and thought content normal.    Review of Systems  Psychiatric/Behavioral: Positive for depression. The patient is nervous/anxious.   All other systems reviewed and are negative.   Blood pressure 110/67, pulse 65, temperature 97.6 F (36.4 C), temperature source Oral, resp. rate 16, height 5\' 2"  (1.575 m), weight 66.679 kg (147 lb), SpO2 92.00%.Body mass index is 26.88 kg/(m^2).  General Appearance: Casual and Fairly Groomed  Eye Contact::  Good  Speech:  Clear and Coherent and Normal Rate  Volume:  Normal  Mood:  Euthymic  Affect:  Appropriate  Thought Process:  Goal Directed and Intact  Orientation:  Full (Time, Place,  and Person)  Thought Content:  WDL  Suicidal Thoughts:  No  Homicidal Thoughts:  No  Memory:  Immediate;   Good Recent;   Good Remote;   Good  Judgement:  Fair  Insight:  Fair  Psychomotor Activity:  Normal  Concentration:  Good  Recall:   Good  Fund of Knowledge:Fair  Language: Good  Akathisia:  No  Handed:  Right  AIMS (if indicated):     Assets:  Communication Skills Desire for Improvement Financial Resources/Insurance Housing  Sleep:  Number of Hours: 5.75   Musculoskeletal: Strength & Muscle Tone: within normal limits Gait & Station: normal Patient leans: N/A  Current Medications: Current Facility-Administered Medications  Medication Dose Route Frequency Provider Last Rate Last Dose  . acetaminophen (TYLENOL) tablet 650 mg  650 mg Oral Q6H PRN Lurena Nida, NP   650 mg at 06/02/13 1443  . alum & mag hydroxide-simeth (MAALOX/MYLANTA) 200-200-20 MG/5ML suspension 30 mL  30 mL Oral Q4H PRN Lurena Nida, NP      . ezetimibe (ZETIA) tablet 10 mg  10 mg Oral Daily Lurena Nida, NP   10 mg at 06/04/13 0754  . hydrOXYzine (ATARAX/VISTARIL) tablet 50 mg  50 mg Oral QHS PRN Lurena Nida, NP   50 mg at 06/03/13 2113  . magnesium hydroxide (MILK OF MAGNESIA) suspension 30 mL  30 mL Oral Daily PRN Lurena Nida, NP      . sertraline (ZOLOFT) tablet 100 mg  100 mg Oral Daily Lurena Nida, NP   100 mg at 06/04/13 2376    Lab Results:  No results found for this or any previous visit (from the past 48 hour(s)).  Physical Findings: AIMS: Facial and Oral Movements Muscles of Facial Expression: None, normal Lips and Perioral Area: None, normal Jaw: None, normal Tongue: None, normal,Extremity Movements Upper (arms, wrists, hands, fingers): None, normal Lower (legs, knees, ankles, toes): None, normal, Trunk Movements Neck, shoulders, hips: None, normal, Overall Severity Severity of abnormal movements (highest score from questions above): None, normal Incapacitation due to abnormal movements: None, normal Patient's awareness of abnormal movements (rate only patient's report): No Awareness, Dental Status Current problems with teeth and/or dentures?: No Does patient usually wear dentures?: No  CIWA:  CIWA-Ar Total:  1 COWS:  COWS Total Score: 1  Treatment Plan Summary: Daily contact with patient to assess and evaluate symptoms and progress in treatment Medication management  Plan: Treatment Plan/Recommendations:  1 Admit for crisis management and stabilization. Estimated length of stay 5-7 days  2 Individual and group therapy. 3 Medication management for depression, and anxiety to reduce current symptoms to base line and improve the overall levels of functioning:  Medications reviewed with the patient and she stated no untoward effects, home medications in place.  4 Coping skills for depression and anxiety developing.  5 Continue crisis stabilization and management.  6 Address health issues- monitor vital signs, stable;  7 Treatment plan in progress to prevent relapse prevention and self care.  8 Psychosocial education regarding relapse prevention and self care 9 Heath care follow up as needed for any health concerns 10 Call for consult with hospitalist for additional specialty patient services as needed.  11. Disposition plans in progress, may discharge tomorrow with plan of IOP.   Medical Decision Making Problem Points:  Established problem, worsening (2), Review of last therapy session (1) and Review of psycho-social stressors (1) Data Points:  Review or order medicine tests (1) Review and summation of  old records (2) Review of medication regiment & side effects (2) Review of new medications or change in dosage (2)  I certify that inpatient services furnished can reasonably be expected to improve the patient's condition.   Parke Simmers Tonja Jezewski  06/04/2013, 12:32 PM

## 2013-06-05 MED ORDER — EZETIMIBE 10 MG PO TABS: 10.0000 mg | ORAL_TABLET | Freq: Every day | ORAL | Status: DC

## 2013-06-05 MED ORDER — SERTRALINE HCL 100 MG PO TABS: 100.0000 mg | ORAL_TABLET | Freq: Every day | ORAL | Status: DC

## 2013-06-05 MED ORDER — CLONAZEPAM 0.5 MG PO TABS: 0.2500 mg | ORAL_TABLET | Freq: Two times a day (BID) | ORAL | Status: DC

## 2013-06-05 MED ORDER — MIRTAZAPINE 7.5 MG PO TABS: 7.5000 mg | ORAL_TABLET | Freq: Every day | ORAL | Status: DC

## 2013-06-05 NOTE — Progress Notes (Signed)
[facility] Adult Case Management Discharge Plan :  Will you be returning to the same living situation after discharge: Yes,  returning home At discharge, do you have transportation home?:Yes,  friend will pick pt up Do you have the ability to pay for your medications:Yes,  provided pt with prescriptions and pt verbalizes ability to afford meds  Release of information consent forms completed and in the chart;  Patient's signature needed at discharge.  Patient to Follow up at: Follow-up Information   Follow up with [facility]. (Call to schedule therapy appointment with Dr. Pervis Hocking)    Contact information:   [address] [place], [place] Phone: [phone]      Follow up with [facility] On 06/26/2013. (Appointment scheduled at 9:30 am on this date with Dr. Doyne Keel for medication management.  Bring completed paperwork to this appointment.  )    Contact information:   [address] [place], [place] [address] Phone: [phone]      Patient denies SI/HI:   Yes,  denies SI/HI    Safety Planning and Suicide Prevention discussed:  Yes,  discussed with pt.  Pt refused consent to provide to family/friend.  See suicide prevention education note.   Law Corsino N Horton 06/05/2013, 11:50 AM

## 2013-06-05 NOTE — Progress Notes (Signed)
Discharge Note:  Patient discharged home with friend.  Denied SI and HI.  Denied A/V hallucinations.  Suicide prevention information given and discussed with patient who stated she understood and had no questions.  Patient stated she received all her belongings, clothing, toiletries, miscellaneous items, prescriptions, medications.  Patient stated she appreciated all assistance received from [facility] staff.

## 2013-06-05 NOTE — Discharge Summary (Signed)
Physician Discharge Summary Note  Patient:  Glenda Klein is an 68 y.o., female MRN:  [id number] DOB:  1945/04/18 Patient phone:  [phone] (home)  Patient address:   [place] [address],  Total Time spent with patient: 30 minutes  Date of Admission:  06/02/2013 Date of Discharge: 06/05/2013  Reason for Admission:  Suicidal  Discharge Diagnoses: Principal Problem:   MDD (major depressive disorder), recurrent episode   Psychiatric Specialty Exam: Physical Exam  ROS  Blood pressure 107/68, pulse 66, temperature 97.9 F (36.6 C), temperature source Oral, resp. rate 20, height 5\' 2"  (1.575 m), weight 66.679 kg (147 lb), SpO2 92.00%.Body mass index is 26.88 kg/(m^2).  General Appearance:   Eye Contact::    Speech:    Volume:    Mood:    Affect:    Thought Process:    Orientation:    Thought Content:    Suicidal Thoughts:    Homicidal Thoughts:    Memory:    Judgement:    Insight:    Psychomotor Activity:    Concentration:    Recall:    Fund of Knowledge:  Language:   Akathisia:    Handed:    AIMS (if indicated):     Assets:    Sleep:  Number of Hours: 6    Past Psychiatric History: Diagnosis:  Hospitalizations:  Outpatient Care:  Substance Abuse Care:  Self-Mutilation:  Suicidal Attempts:  Violent Behaviors:   Musculoskeletal: Strength & Muscle Tone:  Gait & Station:  Patient leans:   DSM5:  Schizophrenia Disorders:   Obsessive-Compulsive Disorders:   Trauma-Stressor Disorders:   Substance/Addictive Disorders:   Depressive Disorders:    Axis Diagnosis:  Discharge Diagnoses:  AXIS I: Major Depression, Recurrent severe  AXIS II: Deferred  AXIS III:  Past Medical History   Diagnosis  Date   .  Depression    .  Hyperlipidemia    .  Renal calculi      RIGHT   .  Mild acid reflux    .  Frequency of urination    .  Urgency of urination    .  Hematuria    .  Arthritis     AXIS IV: other psychosocial or environmental  problems, problems related to social environment and problems with primary support group  AXIS V: 61-70 mild symptoms  Level of Care:  OP  Hospital Course:  Glenda Klein is a 68 year old WF who was BIB to the [facility] for evaluation after she called a suicide hot line.              Glenda Klein was admitted to the adult unit where she was evaluated and her symptoms were identified. Medication management was discussed and implemented. She was encouraged to participate in unit programming. Medical problems were identified and treated appropriately. Home medication was restarted as needed.                Glenda Klein was evaluated each day by a clinical provider to ascertain the patient's response to treatment.  Improvement was noted by the patient's report of decreasing symptoms, improved sleep and appetite, affect, medication tolerance, behavior, and participation in unit programming.        Glenda Klein was asked each day to complete a self inventory noting mood, mental status, pain, new symptoms, anxiety and concerns.         She responded well to medication and being in a therapeutic and supportive  environment. Positive and appropriate behavior was noted and the patient was motivated for recovery.        Glenda Klein worked closely with the treatment team and case manager to develop a discharge plan with appropriate goals. Coping skills, problem solving as well as relaxation therapies were also part of the unit programming.         By the day of discharge Glenda Klein was in much improved condition than upon admission.  Symptoms were reported as significantly decreased or resolved completely.  The patient denied SI/HI and voiced no AVH. She was motivated to continue taking medication with a goal of continued improvement in mental health.          She was discharged home with a plan to follow up as noted below.  Consults:  None  Significant Diagnostic Studies:  None  Discharge Vitals:   Blood pressure 107/68,  pulse 66, temperature 97.9 F (36.6 C), temperature source Oral, resp. rate 20, height 5\' 2"  (1.575 m), weight 66.679 kg (147 lb), SpO2 92.00%. Body mass index is 26.88 kg/(m^2). Lab Results:   No results found for this or any previous visit (from the past 72 hour(s)).  Physical Findings: AIMS: Facial and Oral Movements Muscles of Facial Expression: None, normal Lips and Perioral Area: None, normal Jaw: None, normal Tongue: None, normal,Extremity Movements Upper (arms, wrists, hands, fingers): None, normal Lower (legs, knees, ankles, toes): None, normal, Trunk Movements Neck, shoulders, hips: None, normal, Overall Severity Severity of abnormal movements (highest score from questions above): None, normal Incapacitation due to abnormal movements: None, normal Patient's awareness of abnormal movements (rate only patient's report): No Awareness, Dental Status Current problems with teeth and/or dentures?: No Does patient usually wear dentures?: No  CIWA:  CIWA-Ar Total: 1 COWS:  COWS Total Score: 1  Psychiatric Specialty Exam: See Psychiatric Specialty Exam and Suicide Risk Assessment completed by Attending Physician prior to discharge.  Discharge destination:  Home  Is patient on multiple antipsychotic therapies at discharge:  No   Has Patient had three or more failed trials of antipsychotic monotherapy by history:  No  Recommended Plan for Multiple Antipsychotic Therapies: NA  Discharge Orders   Future Orders Complete By Expires   Diet - low sodium heart healthy  As directed    Discharge instructions  As directed    Increase activity slowly  As directed        Medication List       Indication   clonazePAM 0.5 MG tablet  Commonly known as:  KLONOPIN  Take 0.5 tablets (0.25 mg total) by mouth 2 (two) times daily.   Indication:  Panic Disorder     ezetimibe 10 MG tablet  Commonly known as:  ZETIA  Take 1 tablet (10 mg total) by mouth daily.   Indication:  Elevation of  Both Cholesterol and Triglycerides in Blood     mirtazapine 7.5 MG tablet  Commonly known as:  REMERON  Take 1 tablet (7.5 mg total) by mouth at bedtime.   Indication:  Trouble Sleeping     sertraline 100 MG tablet  Commonly known as:  ZOLOFT  Take 1 tablet (100 mg total) by mouth daily.   Indication:  Major Depressive Disorder           Follow-up Information   Follow up with [place]. (Call to schedule therapy appointment with Dr. Pervis Hocking)    Contact information:   [address] [place], [place] Phone: [phone]  Follow up with [facility] On 06/26/2013. (Appointment scheduled at 9:30 am on this date with Dr. Doyne Keel for medication management.  Bring completed paperwork to this appointment.  )    Contact information:   [address] [place], [place] [address] Phone: [phone]      Follow-up recommendations:   Activities: Resume activity as tolerated. Diet: Heart healthy low sodium diet Tests: Follow up testing will be determined by your out patient provider. Comments:   Total Discharge Time:  Less than 30 minutes.  Signed: Nena Polio 06/05/2013, 12:09 PM  Patient was seen face-to-face for psychiatric evaluation, suicide risk assessment, case discussed with the treatment team and a physician extender. Made appropriate disposition plan. Reviewed the information documented and agree with the treatment plan.   Parke Simmers Jamesha Ellsworth 06/22/2013 8:07 PM

## 2013-06-05 NOTE — Progress Notes (Signed)
D:  Patient's self inventory sheet, patient sleeps well, good appetite, normal energy level, good attention span.  Rated depression and hopeless #1.  Denied withdrawals.  Denied SI.  Denied physical problems.  Worst pain 1, zero pain goal.  Plans to take anxiety pill when she becomes fearful or upset.  Please do not let one or two individuals dominate the classes.  Does have discharge plans.  No problems taking medications after discharge. A:  Medications administered per MD orders.  Emotional support and encouragement given patient. R:  Denied SI and HI.  Contracts for safety.  Denied A/V hallucinations.  Will continue to monitor patient for safety with 15 minute checks.  Safety maintained.

## 2013-06-05 NOTE — Progress Notes (Signed)
The focus of this group is to educate the patient on the purpose and policies of crisis stabilization and provide a format to answer questions about their admission.  The group details unit policies and expectations of patients while admitted.  Patient actively participated, appropriate affect, alert, appropriate insight and engagement.  Today patient will work on 3 goals for discharge.

## 2013-06-05 NOTE — [facility] Suicide Risk Assessment (Signed)
   Demographic Factors:  Age 68 or older, Caucasian and Living alone  Total Time spent with patient: 30 minutes  Psychiatric Specialty Exam: Physical Exam  ROS  Blood pressure 107/68, pulse 66, temperature 97.9 F (36.6 C), temperature source Oral, resp. rate 20, height 5\' 2"  (1.575 m), weight 66.679 kg (147 lb), SpO2 92.00%.Body mass index is 26.88 kg/(m^2).  General Appearance: Casual  Eye Contact::  Good  Speech:  Clear and Coherent  Volume:  Normal  Mood:  Euthymic  Affect:  Appropriate and Congruent  Thought Process:  Coherent and Goal Directed  Orientation:  Full (Time, Place, and Person)  Thought Content:  WDL  Suicidal Thoughts:  No  Homicidal Thoughts:  No  Memory:  Immediate;   Good  Judgement:  Good  Insight:  Good  Psychomotor Activity:  Normal  Concentration:  Good  Recall:  Good  Fund of Knowledge:Good  Language: Good  Akathisia:  NA  Handed:  Right  AIMS (if indicated):     Assets:  Communication Skills Desire for Improvement Financial Resources/Insurance [place] Talents/Skills Transportation Vocational/Educational  Sleep:  Number of Hours: 6    Musculoskeletal: Strength & Muscle Tone: within normal limits Gait & Station: normal Patient leans: N/A   Mental Status Per Nursing Assessment::   On Admission:     Current Mental Status by Physician: NA  Loss Factors: Financial problems/change in socioeconomic status  Historical Factors: NA  Risk Reduction Factors:   Sense of responsibility to family, Religious beliefs about death, Positive social support, Positive therapeutic relationship and Positive coping skills or problem solving skills  Continued Clinical Symptoms:  Depression:   Recent sense of peace/wellbeing  Cognitive Features That Contribute To Risk:  Polarized thinking    Suicide Risk:  Minimal: No identifiable suicidal ideation.  Patients presenting with no risk factors but with  morbid ruminations; may be classified as minimal risk based on the severity of the depressive symptoms  Discharge Diagnoses:   AXIS I:  Major Depression, Recurrent severe AXIS II:  Deferred AXIS III:   Past Medical History  Diagnosis Date  . Depression   . Hyperlipidemia   . Renal calculi     RIGHT  . Mild acid reflux   . Frequency of urination   . Urgency of urination   . Hematuria   . Arthritis    AXIS IV:  other psychosocial or environmental problems, problems related to social environment and problems with primary support group AXIS V:  61-70 mild symptoms  Plan Of Care/Follow-up recommendations:  Activity:  As tolerated Diet:  Regular  Is patient on multiple antipsychotic therapies at discharge:  No   Has Patient had three or more failed trials of antipsychotic monotherapy by history:  No  Recommended Plan for Multiple Antipsychotic Therapies: NA    Janardhaha R Argenis Kumari 06/05/2013, 10:24 AM

## 2013-06-05 NOTE — [facility] Suicide Risk Assessment (Signed)
[facility] Adult Inpatient Family/Significant Other Suicide Prevention Education  Suicide Prevention Education:   Patient Refusal for Family/Significant Other Suicide Prevention Education: The patient has refused to provide written consent for family/significant other to be provided Family/Significant Other Suicide Prevention Education during admission and/or prior to discharge.  Physician notified.  CSW provided suicide prevention information with patient.    The suicide prevention education provided includes the following:  Suicide risk factors  Suicide prevention and interventions  National Suicide Hotline telephone number  [facility] assessment telephone number  [facility] Emergency Assistance [employer] and/or Residential Mobile Crisis Unit telephone number   Regan Lemming, [place] 06/05/2013 9:03 AM

## 2013-06-07 NOTE — Progress Notes (Signed)
Patient Discharge Instructions:  Next Level Care Provider Has Access to the EMR, 06/07/13 Records provided To [facility] via [facility]/Epic access.  Glenda Klein, 06/07/2013, 3:13 PM

## 2013-06-26 ENCOUNTER — Ambulatory Visit (INDEPENDENT_AMBULATORY_CARE_PROVIDER_SITE_OTHER): Payer: No Typology Code available for payment source | Admitting: Psychiatry

## 2013-06-26 ENCOUNTER — Encounter (HOSPITAL_COMMUNITY): Payer: Self-pay | Admitting: Psychiatry

## 2013-06-26 VITALS — BP 114/68 | HR 71 | Ht 65.0 in | Wt 151.0 lb

## 2013-06-26 DIAGNOSIS — F341 Dysthymic disorder: Secondary | ICD-10-CM

## 2013-06-26 DIAGNOSIS — F331 Major depressive disorder, recurrent, moderate: Secondary | ICD-10-CM | POA: Insufficient documentation

## 2013-06-26 MED ORDER — SERTRALINE HCL 100 MG PO TABS: 100.0000 mg | ORAL_TABLET | Freq: Every day | ORAL | Status: DC

## 2013-06-26 MED ORDER — BUSPIRONE HCL 10 MG PO TABS: 10.0000 mg | ORAL_TABLET | Freq: Three times a day (TID) | ORAL | Status: DC

## 2013-06-26 NOTE — Progress Notes (Signed)
Psychiatric Assessment Adult  Patient Identification:  Glenda Klein Date of Evaluation:  06/26/2013 Chief Complaint: depressed History of Chief Complaint:   Chief Complaint  Patient presents with  . Depression    HPI Comments: Reports she is "the same as before, nothing has changed". Divorce preceding are on going and it appears he will get 50% of her assets. No longer cares about losing her husband. Depression is on going and she feels sad most days of the week. Level is 4/10. Sleep is poor and it takes her a long time to fall asleep even with 12.5mg  of Benadryl.  States she will then sleep for 7 hrs and she wakes up tired. Remeron was too strong and she is not taking it. Appetite and concentration are good. Endorsing feeling worthless and hopeless with anhedonia. Also reports low self esteem and low motivation to do anything. Denies SI and HI. States she doesn't want to kill herself but she doesn't feel there is any reason to live. Pt has been taking Zoloft 100mg  for 25 yrs.  States any legal work related to her divorce causes extreme anxiety. She feels like people are trying to force to react. States all her anxiety is related to her legal problems. States anxiety is always in the back of her mind. Endorsing racing thoughts and insomnia. Also reports fatigue but that could be related to Benadryl use. Denies GI upset and headaches. Denies anxiety interfering with activities. Has not taken Clonazepam b/c her insurance wouldn't cover it.    Review of Systems  Constitutional: Positive for fatigue.  HENT: Negative.   Eyes: Negative.   Respiratory: Negative.   Cardiovascular: Negative.   Gastrointestinal: Negative.   Musculoskeletal: Negative.   Neurological: Negative.   Psychiatric/Behavioral: Positive for sleep disturbance and dysphoric mood. The patient is nervous/anxious.    Physical Exam  Constitutional: She appears well-developed and well-nourished.  Psychiatric: Her speech is  normal and behavior is normal. Judgment and thought content normal. Cognition and memory are normal. She exhibits a depressed mood.    Depressive Symptoms: Yes see HPI  (Hypo) Manic Symptoms:   Elevated Mood:  No Irritable Mood:  No Grandiosity:  No Distractibility:  No Labiality of Mood:  No Delusions:  No Hallucinations:  No Impulsivity:  No Sexually Inappropriate Behavior:  No Financial Extravagance:  No Flight of Ideas:  No  Anxiety Symptoms: Excessive Worry:  yes see HPI Panic Symptoms:  No Agoraphobia:  No Obsessive Compulsive: No  Symptoms: None, Specific Phobias:  No Social Anxiety:  No  Psychotic Symptoms:  Hallucinations: No None Delusions:  No Paranoia:  No   Ideas of Reference:  No  PTSD Symptoms: Ever had a traumatic exposure:  No Had a traumatic exposure in the last month:  No Re-experiencing: No None Hypervigilance:  No Hyperarousal: No None Avoidance: No None  Traumatic Brain Injury: No   Past Psychiatric History: Diagnosis: Bipolar d/o?, MDD  Hospitalizations: [facility] 4/11-4/14/2015 for depression and SI  Outpatient Care: began seeing a psychiatrist 25 yrs ago and was treated with Zoloft  Substance Abuse Care: denies  Self-Mutilation: denies  Suicidal Attempts: denies  Violent Behaviors: denies   Past Medical History:   Past Medical History  Diagnosis Date  . Depression   . Hyperlipidemia   . Renal calculi     RIGHT  . Mild acid reflux   . Frequency of urination   . Urgency of urination   . Hematuria   . Arthritis    History of  Loss of Consciousness:  No Seizure History:  No Cardiac History:  No Allergies:   Allergies  Allergen Reactions  . Statins Other (See Comments)    Extremity weakness   Current Medications:  Current Outpatient Prescriptions  Medication Sig Dispense Refill  . ezetimibe (ZETIA) 10 MG tablet Take 1 tablet (10 mg total) by mouth daily.      . sertraline (ZOLOFT) 100 MG tablet Take 1 tablet (100 mg total) by  mouth daily.  30 tablet  0  . clonazePAM (KLONOPIN) 0.5 MG tablet Take 0.5 tablets (0.25 mg total) by mouth 2 (two) times daily.  30 tablet  0  . mirtazapine (REMERON) 7.5 MG tablet Take 1 tablet (7.5 mg total) by mouth at bedtime.  30 tablet  0   No current facility-administered medications for this visit.    Previous Psychotropic Medications:  Medication Dose   denies                      Substance Abuse History in the last 12 months: Substance Age of 1st Use Last Use Amount Specific Type  Nicotine   30 1/2 pack per day cigs  Alcohol  denies        Cannabis  denies        Opiates  denies        Cocaine  denies        Methamphetamines  denies        LSD  denies        Ecstasy  denies         Benzodiazepines  denies        Caffeine  denies        Inhalants  denies        Others: denies                         Medical Consequences of Substance Abuse: denies  Legal Consequences of Substance Abuse: denies Family Consequences of Substance Abuse: denies  Blackouts:  No DT's:  No Withdrawal Symptoms:  No None  Social History: Current Place of Residence: [place] of Birth: [place] Family Members: daughter. No siblings Marital Status:  married for 30 yrs and currently going thru divorce. First marriage for 16 yrs but divorced b/c he cheated on her. 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 Legal History: current divorce Hobbies/Interests: denies  Family History:   Family History  Problem Relation Age of Onset  . ADD / ADHD Neg Hx   . Alcohol abuse Neg Hx   . Anxiety disorder Neg Hx   . Bipolar disorder Neg Hx   . Dementia Neg Hx   . Depression Neg Hx   . Drug  abuse Neg Hx   . Schizophrenia Neg Hx     Mental Status Examination/Evaluation: Objective:  Appearance: Fairly Groomed  Eye Contact::  Good  Speech:  Clear and Coherent and Normal Rate  Volume:  Normal  Mood:  depressed  Affect:  Congruent and Depressed  Thought Process:  Goal Directed, Linear and Logical  Orientation:  Full (Time, Place, and Person)  Thought Content:  WDL  Suicidal Thoughts:  No  Homicidal Thoughts:  No  Judgement:  Good  Insight:  Fair  Psychomotor Activity:  Normal  Akathisia:  No  Handed:  Right  AIMS (if indicated):  N/a  Assets:  Communication Skills Desire for Improvement Financial Resources/Insurance Housing Leisure Time Resilience Talents/Skills Transportation Vocational/Educational    Laboratory/X-Ray Psychological Evaluation(s)   reviewed 06/01/13- showing signs of anemia (discussed with pt)  denies   Assessment:  Axis I: MDD with anxiety  AXIS I MDD with anxiety  AXIS II Deferred  AXIS III Past Medical History  Diagnosis Date  . Depression   . Hyperlipidemia   . Renal calculi     RIGHT  . Mild acid reflux   . Frequency of urination   . Urgency of urination   . Hematuria   . Arthritis      AXIS IV problems related to legal system/crime and problems with primary support group  AXIS V 51-60 moderate symptoms   Treatment Plan/Recommendations:  Plan of Care:  Start trial of Buspar, risks/benefits and SE of the medication discussed. Pt verbalized understanding and verbal consent obtained for treatment.   Laboratory:  reviewed with pt  Psychotherapy: pt will set up therapy with someone recommended for depression and anxiety  Medications: Continue Zoloft 100mg  po qD for mood. Start Trial of Buspar 10mg  qHS for mood and anxiety. D/c Remeron and Clonazepam and Benadryl  Routine PRN Medications:  No  Consultations: none at this time  Safety Concerns:  Pt denies SI and is at an acute low risk for suicide. Crisis plan reviewed and  discussed. Pt verbalized understanding.   Other:  F/up in 2 months or sooner if needed     Charlcie Cradle, MD 5/5/201510:02 AM

## 2013-06-26 NOTE — Patient Instructions (Signed)
Continue Zoloft 100mg  daily for mood.   Start Trial of Buspar 10mg  at bedtime for mood and anxiety.    Discontinue Remeron and Clonazepam and Benadryl

## 2013-07-03 ENCOUNTER — Telehealth (HOSPITAL_COMMUNITY): Payer: Self-pay

## 2013-07-03 NOTE — Telephone Encounter (Signed)
Pt is only to take the medication at bedtime (30 tabs).

## 2013-07-04 NOTE — Telephone Encounter (Signed)
Left message:Instructed pt to take Buspirone 1 at bedtime.Directions to take 3 times/day are incorrect.30 day supply is enough to take 1 at bedtime.For questions or concerns, contact office.

## 2013-08-28 ENCOUNTER — Ambulatory Visit (INDEPENDENT_AMBULATORY_CARE_PROVIDER_SITE_OTHER): Payer: No Typology Code available for payment source | Admitting: Psychiatry

## 2013-08-28 ENCOUNTER — Encounter (HOSPITAL_COMMUNITY): Payer: Self-pay | Admitting: Psychiatry

## 2013-08-28 VITALS — BP 121/75 | HR 67 | Ht 65.0 in | Wt 148.8 lb

## 2013-08-28 DIAGNOSIS — F341 Dysthymic disorder: Secondary | ICD-10-CM

## 2013-08-28 DIAGNOSIS — F331 Major depressive disorder, recurrent, moderate: Secondary | ICD-10-CM

## 2013-08-28 MED ORDER — SERTRALINE HCL 100 MG PO TABS: 100.0000 mg | ORAL_TABLET | Freq: Every day | ORAL | Status: DC

## 2013-08-28 MED ORDER — BUSPIRONE HCL 10 MG PO TABS: 10.0000 mg | ORAL_TABLET | Freq: Two times a day (BID) | ORAL | Status: DC

## 2013-08-28 NOTE — Progress Notes (Signed)
[facility] [phone] Progress Note  ADDIE ALONGE [id number] 67 y.o.  08/28/2013 9:34 AM  Chief Complaint: Anxiety is still high  History of Present Illness: Pt is now divorced.  Her ex husband received a small % of her assets. Pt is going to have to move out of her home b/c she can no longer afford to live there. States she is anxious to start over and feels at a loss as to what to do. Pt is thinking of moving to [place] when the house sells. Pt is anxious about what the future holds. She is moving forward and is trying to sell things in her house.   Pt only took Buspar twice b/c she mistook the instructions. Depression has been present for years. Pt states she is better. Depression is minorly improved. Level is 7/10 and she isn't depressed on a daily basis (2-3x/week). Crying spells are less frequent. Reports keeping busy helps. Endorsing anhedonia. Denies low motivation. Sleep is improved and she is getting 7-8 hrs/night. Energy, appetite and concentration are good. Denies SI but is "waiting to die and has nothing to look forward to".  Pt has been compliant with Zoloft and denies SE.   Suicidal Ideation: No Plan Formed: No Patient has means to carry out plan: No  Homicidal Ideation: No Plan Formed: No Patient has means to carry out plan: No  Review of Systems: Psychiatric: Agitation: Yes Hallucination: No Depressed Mood: Yes Insomnia: No Hypersomnia: No Altered Concentration: No Feels Worthless: not sure but thinks yes Grandiose Ideas: No Belief In Special Powers: No New/Increased Substance Abuse: No Compulsions: No  Neurologic: Headache: No Seizure: No Paresthesias: No  Past Medical Family, Social History: Pt is divorced and has 1 daughter. No siblings. 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 Denies family psychiatric history.   Outpatient Encounter Prescriptions as of 08/28/2013  Medication Sig  . busPIRone (BUSPAR) 10 MG tablet Take 1  tablet (10 mg total) by mouth 3 (three) times daily.  Marland Kitchen ezetimibe (ZETIA) 10 MG tablet Take 1 tablet (10 mg total) by mouth daily.  . sertraline (ZOLOFT) 100 MG tablet Take 1 tablet (100 mg total) by mouth daily.    Past Psychiatric History/Hospitalization(s): Anxiety: No Bipolar Disorder: thinks she may have been diagnosed with Bipolar disorder Depression: Yes Mania: No Psychosis: No Schizophrenia: No Personality Disorder: No Hospitalization for psychiatric illness: Yes History of Electroconvulsive Shock Therapy: No Prior Suicide Attempts: No  Physical Exam: Constitutional:  BP 121/75  Pulse 67  Ht 5\' 5"  (1.651 m)  Wt 148 lb 12.8 oz (67.495 kg)  BMI 24.76 kg/m2  General Appearance: alert, oriented, no acute distress and well nourished  Musculoskeletal: Strength & Muscle Tone: within normal limits Gait & Station: normal Patient leans: N/A  Mental Status Examination/Evaluation:  Objective: Appearance: fairly groomed, appears to be stated age  Attitude: Calm and cooperative  Eye Contact: Fair   Speech and Language: Clear and Coherent, spontaneous, normal rate  Volume: Normal   Mood: anxious  Affect: congruent   Thought Process: Coherent   Attention/Concentration: WNL  Orientation: Full (Time, Place, and Person)   Thought Content: WDL  Suicidal Thoughts: No  Homicidal Thoughts: No   Judgement: Fair   General fund of knowledge: average to above average  Insight: Present   Psychomotor Activity: Normal   Akathisia: No   Handed: Right   AIMS (if indicated): n/a   Medical Decision Making (Choose Three): Review of Psycho-Social Stressors (  1), Review or order clinical lab tests (1), Review of Medication Regimen & Side Effects (2) and Review of New Medication or Change in Dosage (2)  Assessment: AXIS I  MDD with anxiety   AXIS II  Deferred   AXIS III  Past Medical History    Diagnosis  Date    .  Depression     .  Hyperlipidemia     .  Renal calculi       RIGHT     .  Mild acid reflux     .  Frequency of urination     .  Urgency of urination     .  Hematuria     .  Arthritis    AXIS IV  problems related to legal system/crime and problems with primary support group   AXIS V  51-60 moderate symptoms      Treatment Plan/Recommendations:  Plan of Care:  Medication management with supportive therapy. Risks/benefits and SE of the medication discussed. Pt verbalized understanding and verbal consent obtained for treatment.  Affirm with the patient that the medications are taken as ordered. Patient expressed understanding of how their medications were to be used.     Laboratory: reviewed with pt   Psychotherapy: pt will set up therapy with someone recommended for depression and anxiety   Medications: Continue Zoloft 100mg  po qD for mood. Start Trial of Buspar 10mg  BID for mood and anxiety (pt never started taking it).    Routine PRN Medications: No   Consultations: none at this time   Safety Concerns: Pt denies SI and is at an acute low risk for suicide. Crisis plan reviewed and discussed. Pt verbalized understanding.   Other: F/up in 2 months or sooner if needed     Charlcie Cradle, MD 08/28/2013

## 2013-10-02 ENCOUNTER — Other Ambulatory Visit (HOSPITAL_COMMUNITY): Payer: Self-pay | Admitting: Psychiatry

## 2013-10-02 DIAGNOSIS — F331 Major depressive disorder, recurrent, moderate: Secondary | ICD-10-CM

## 2013-10-02 MED ORDER — BUSPIRONE HCL 10 MG PO TABS: 10.0000 mg | ORAL_TABLET | Freq: Two times a day (BID) | ORAL | Status: DC

## 2013-10-30 ENCOUNTER — Encounter (HOSPITAL_COMMUNITY): Payer: Self-pay | Admitting: Psychiatry

## 2013-10-30 ENCOUNTER — Ambulatory Visit (INDEPENDENT_AMBULATORY_CARE_PROVIDER_SITE_OTHER): Payer: Medicare Other | Admitting: Psychiatry

## 2013-10-30 VITALS — BP 102/67 | HR 73 | Ht 65.0 in | Wt 148.2 lb

## 2013-10-30 DIAGNOSIS — F341 Dysthymic disorder: Secondary | ICD-10-CM | POA: Diagnosis not present

## 2013-10-30 DIAGNOSIS — F331 Major depressive disorder, recurrent, moderate: Secondary | ICD-10-CM

## 2013-10-30 MED ORDER — BUSPIRONE HCL 15 MG PO TABS: 15.0000 mg | ORAL_TABLET | Freq: Two times a day (BID) | ORAL | Status: DC

## 2013-10-30 MED ORDER — SERTRALINE HCL 100 MG PO TABS: 100.0000 mg | ORAL_TABLET | Freq: Every day | ORAL | Status: DC
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
## 2014-01-29 ENCOUNTER — Encounter (HOSPITAL_COMMUNITY): Payer: Self-pay | Admitting: Psychiatry

## 2014-01-29 ENCOUNTER — Ambulatory Visit (INDEPENDENT_AMBULATORY_CARE_PROVIDER_SITE_OTHER): Payer: Medicare Other | Admitting: Psychiatry

## 2014-01-29 VITALS — BP 113/74 | HR 72 | Wt 147.2 lb

## 2014-01-29 DIAGNOSIS — R45851 Suicidal ideations: Secondary | ICD-10-CM | POA: Diagnosis not present

## 2014-01-29 DIAGNOSIS — F419 Anxiety disorder, unspecified: Secondary | ICD-10-CM | POA: Diagnosis not present

## 2014-01-29 DIAGNOSIS — F331 Major depressive disorder, recurrent, moderate: Secondary | ICD-10-CM | POA: Diagnosis not present

## 2014-01-29 MED ORDER — BUSPIRONE HCL 10 MG PO TABS: 20.0000 mg | ORAL_TABLET | Freq: Two times a day (BID) | ORAL | Status: DC

## 2014-01-29 MED ORDER — SERTRALINE HCL 100 MG PO TABS: 100.0000 mg | ORAL_TABLET | Freq: Every day | ORAL | Status: DC

## 2014-01-29 NOTE — Progress Notes (Signed)
[facility] [phone] Progress Note  Glenda Klein [id number] 68 y.o.  01/29/2014 10:03 AM  Chief Complaint: "fair, just fair"  History of Present Illness:  Over the last 2 weeks she has had some major stressors. Pt has been trying to get her husband's social security but was denied. In addition her monthly medicare payments are going up even though she is divorced. She is suing her ex husband for alienation of affection.   These stressors are causing her to feel down.   Pt continues to go the the department of mental health and is attending the peer group. She has signed up for a class. Pt is now getting a life coach.   Depression is overall getting worse. Pt is feeling overwhelmed. She is taking a hit financially. Denies anhedonia. Endorsing crying spells and worthlessness and low motivation.  Pt is still going to [employer]. Appetite is good. Energy is low. She still wakes up at 3am every night and she can sometimes fall back asleep.   Pt can't move until she sells her house. It on is on the market.   No longer having racing thoughts.   States Buspar and Zoloft are helping and denies SE.   Suicidal Ideation: Yes passive Plan Formed: No Patient has means to carry out plan: No  Homicidal Ideation: No Plan Formed: No Patient has means to carry out plan: No  Review of Systems: Psychiatric: Agitation: Yes Hallucination: No Depressed Mood: Yes Insomnia: Yes Hypersomnia: No Altered Concentration: No Feels Worthless: Yes Grandiose Ideas: No Belief In Special Powers: No New/Increased Substance Abuse: No Compulsions: No  Neurologic: Headache: Yes Seizure: No Paresthesias: No   Review of Systems  Constitutional: Negative for fever, chills and malaise/fatigue.  HENT: Negative for congestion, nosebleeds and sore throat.   Eyes: Negative for blurred vision, double vision and redness.  Respiratory: Negative for cough, sputum production and shortness of breath.    Cardiovascular: Negative for chest pain, palpitations and leg swelling.  Musculoskeletal: Negative for myalgias, back pain, joint pain and neck pain.  Skin: Negative for itching and rash.  Neurological: Positive for headaches. Negative for weakness.  Psychiatric/Behavioral: Positive for depression and suicidal ideas. Negative for hallucinations and substance abuse. The patient is nervous/anxious and has insomnia.      Past Medical Family, Social History: Pt is divorced and has 1 daughter. No siblings. 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 Pt is a former smoker, denies drug and alcohol use. Denies family psychiatric history.  Past Medical History  Diagnosis Date  . Depression   . Hyperlipidemia   . Renal calculi     RIGHT  . Mild acid reflux   . Frequency of urination   . Urgency of urination   . Hematuria   . Arthritis   . Anemia    Outpatient Encounter Prescriptions as of 01/29/2014  Medication Sig  . busPIRone (BUSPAR) 15 MG tablet Take 1 tablet (15 mg total) by mouth 2 (two) times daily.  Marland Kitchen ezetimibe (ZETIA) 10 MG tablet Take 1 tablet (10 mg total) by mouth daily.  . ferrous sulfate 325 (65 FE) MG tablet Take 325 mg by mouth daily with breakfast.  . sertraline (ZOLOFT) 100 MG tablet Take 1 tablet (100 mg total) by mouth daily.    Past Psychiatric History/Hospitalization(s): Anxiety: No Bipolar Disorder: thinks she may have been diagnosed with Bipolar disorder Depression: Yes Mania: No Psychosis: No Schizophrenia: No Personality Disorder: No Hospitalization for psychiatric illness: Yes History  of Electroconvulsive Shock Therapy: No Prior Suicide Attempts: No  Physical Exam: Constitutional:  There were no vitals taken for this visit.  General Appearance: alert, oriented, no acute distress and well nourished  Musculoskeletal: Strength & Muscle Tone: within normal limits Gait & Station: normal Patient leans: N/A  Mental Status  Examination/Evaluation: Objective: Attitude: Calm and cooperative  Appearance: Fairly Groomed, appears to be stated age  Eye Contact::  Good  Speech:  Clear and Coherent and Normal Rate  Volume:  Normal  Mood:  depressed  Affect:  Full Range  Thought Process:  Goal Directed, Linear and Logical  Orientation:  Full (Time, Place, and Person)  Thought Content:  Negative  Suicidal Thoughts:  Yes.  without intent/plan  Homicidal Thoughts:  No  Judgement:  Fair  Insight:  Fair  Concentration: good  Memory: Immediate-fair Recent-fair Remote-fair  Recall: fair  Language: fair  Gait and Station: normal  [employer] of Knowledge: average  Psychomotor Activity:  Normal  Akathisia:  No  Handed:  Right  AIMS (if indicated):  n/a    Assets:  Armed forces logistics/support/administrative officer Desire for Improvement Financial Resources/Insurance Housing Resilience Social Support Sales promotion account executive (Choose Three): Review of Psycho-Social Stressors (1), Established Problem, Worsening (2), Review of Medication Regimen & Side Effects (2) and Review of New Medication or Change in Dosage (2)  Assessment: AXIS I  MDD- recurrent, moderate with anxiety   AXIS II  Deferred   AXIS III  Past Medical History    Diagnosis  Date    .  Depression     .  Hyperlipidemia     .  Renal calculi       RIGHT    .  Mild acid reflux     .  Frequency of urination     .  Urgency of urination     .  Hematuria     .  Arthritis    AXIS IV  problems related to legal system/crime and problems with primary support group   AXIS V  51-60 moderate symptoms      Treatment Plan/Recommendations:  Plan of Care:  Medication management with supportive therapy. Risks/benefits and SE of the medication discussed. Pt verbalized understanding and verbal consent obtained for treatment.  Affirm with the patient that the medications are taken as ordered. Patient expressed understanding of  how their medications were to be used.     Laboratory: none ordered at this time   Psychotherapy: Therapy: brief supportive therapy provided. Discussed psychosocial stressors in detail.      Medications: Continue Zoloft 100mg  po qD for mood. Increase Buspar 20mg  BID for mood and anxiety  Routine PRN Medications: No   Consultations: Encouraged to continue classes at the department [facility] mental [facility]   Safety Concerns: Pt endorsing passiveSI without plan or intent. She is at an acute low risk for suicide. Patient told to call clinic if any problems occur. Patient advised to go to ER if they should develop SI/HI, side effects, or if symptoms worsen. Has crisis numbers to call if needed. Pt verbalized understanding.    Other: F/up in 3 months or sooner if needed     Charlcie Cradle, MD 01/29/2014

## 2014-04-30 ENCOUNTER — Ambulatory Visit (HOSPITAL_COMMUNITY): Payer: Self-pay | Admitting: Psychiatry

## 2014-06-01 ENCOUNTER — Emergency Department (INDEPENDENT_AMBULATORY_CARE_PROVIDER_SITE_OTHER)
Admission: EM | Admit: 2014-06-01 | Discharge: 2014-06-01 | Disposition: A | Discharge status: Home / Self Care | Payer: PPO | Source: Home / Self Care | Attending: Family Medicine | Admitting: Family Medicine

## 2014-06-01 ENCOUNTER — Encounter (HOSPITAL_COMMUNITY): Payer: Self-pay | Admitting: Emergency Medicine

## 2014-06-01 DIAGNOSIS — R0982 Postnasal drip: Secondary | ICD-10-CM

## 2014-06-01 DIAGNOSIS — R059 Cough, unspecified: Secondary | ICD-10-CM

## 2014-06-01 DIAGNOSIS — J301 Allergic rhinitis due to pollen: Secondary | ICD-10-CM | POA: Diagnosis not present

## 2014-06-01 DIAGNOSIS — R05 Cough: Secondary | ICD-10-CM

## 2014-06-01 MED ORDER — TRIAMCINOLONE ACETONIDE 40 MG/ML IJ SUSP
40.0000 mg | Freq: Once | INTRAMUSCULAR | Status: AC
  Administered 2014-06-01: 40 mg via INTRAMUSCULAR

## 2014-06-01 MED ORDER — TRIAMCINOLONE ACETONIDE 40 MG/ML IJ SUSP
INTRAMUSCULAR | Status: AC
  Filled 2014-06-01: qty 1

## 2014-06-01 MED ORDER — GUAIFENESIN-CODEINE 100-10 MG/5ML PO SYRP: 5.0000 mL | ORAL_SOLUTION | ORAL | Status: DC | PRN

## 2014-06-01 MED ORDER — PREDNISONE 20 MG PO TABS: ORAL_TABLET | ORAL | Status: DC

## 2014-06-01 MED ORDER — IPRATROPIUM BROMIDE 0.06 % NA SOLN: 2.0000 | Freq: Four times a day (QID) | NASAL | Status: DC

## 2014-06-01 NOTE — ED Provider Notes (Signed)
CSN: [id number]     Arrival date & time 06/01/14  1033 History   First MD Initiated Contact with Patient 06/01/14 1113     Chief Complaint  Patient presents with  . Cough   (Consider location/radiation/quality/duration/timing/severity/associated sxs/prior Treatment) HPI Comments: 69 year old well-preserved female complaining of a cough, sore throat, stuffy nose, achy all over headache for approximately one week. She denies PND.  Patient is a 69 y.o. female presenting with cough.  Cough Associated symptoms: myalgias and rhinorrhea   Associated symptoms: no chest pain, no chills, no ear pain, no fever, no shortness of breath, no sore throat and no wheezing     Past Medical History  Diagnosis Date  . Depression   . Hyperlipidemia   . Renal calculi     RIGHT  . Mild acid reflux   . Frequency of urination   . Urgency of urination   . Hematuria   . Arthritis   . Anemia    Past Surgical History  Procedure Laterality Date  . Inner ear surgery Right yrs ago  . Wrist surgery Right 2010  . Cystoscopy with stent placement Right 08/20/2012    Procedure: CYSTOSCOPY WITH STENT PLACEMENT;  Surgeon: Franchot Gallo, MD;  Location: [facility];  Service: Urology;  Laterality: Right;  . Knee arthroscopy Left 2012  . Necklift    . Cystoscopy with ureteroscopy, stone basketry and stent placement Right 09/21/2012    Procedure: CYSTOSCOPY WITH URETEROSCOPY, Retrograde Pyelogram;  Surgeon: Franchot Gallo, MD;  Location: [facility];  Service: Urology;  Laterality: Right;   Family History  Problem Relation Age of Onset  . ADD / ADHD Neg Hx   . Alcohol abuse Neg Hx   . Anxiety disorder Neg Hx   . Bipolar disorder Neg Hx   . Dementia Neg Hx   . Depression Neg Hx   . Drug abuse Neg Hx   . Schizophrenia Neg Hx   . Suicidality Neg Hx    History  Substance Use Topics  . Smoking status: Former Smoker -- 0.50 packs/day for 10 years    Types: Cigarettes    Quit date: 09/15/1978  .  Smokeless tobacco: Never Used  . Alcohol Use: No   OB History    No data available     Review of Systems  Constitutional: Positive for activity change. Negative for fever and chills.  HENT: Positive for congestion and rhinorrhea. Negative for ear discharge, ear pain, postnasal drip and sore throat.   Respiratory: Positive for cough. Negative for shortness of breath and wheezing.   Cardiovascular: Negative for chest pain.  Gastrointestinal: Negative.   Musculoskeletal: Positive for myalgias.  Neurological: Negative.     Allergies  Statins  Home Medications   Prior to Admission medications   Medication Sig Start Date End Date Taking? Authorizing Provider  ezetimibe (ZETIA) 10 MG tablet Take 1 tablet (10 mg total) by mouth daily. 06/05/13  Yes Milta Deiters T Mashburn, PA-C  sertraline (ZOLOFT) 100 MG tablet Take 1 tablet (100 mg total) by mouth daily. 01/29/14  Yes Charlcie Cradle, MD  busPIRone (BUSPAR) 10 MG tablet Take 2 tablets (20 mg total) by mouth 2 (two) times daily. 01/29/14   Charlcie Cradle, MD  ferrous sulfate 325 (65 FE) MG tablet Take 325 mg by mouth daily with breakfast.    Historical Provider, MD  guaiFENesin-codeine (CHERATUSSIN AC) 100-10 MG/5ML syrup Take 5 mLs by mouth every 4 (four) hours as needed for cough or congestion. 06/01/14   Shanon Brow  Cabot Cromartie, NP  ipratropium (ATROVENT) 0.06 % nasal spray Place 2 sprays into both nostrils 4 (four) times daily. 06/01/14   Janne Napoleon, NP  predniSONE (DELTASONE) 20 MG tablet Take 2 tabs po on first day, 2 tabs second day, 1 tabs third day, 1 tab fourth day, 1 tab 5th day. Take with food. 06/01/14   Janne Napoleon, NP   BP 113/75 mmHg  Pulse 80  Temp(Src) 99.1 F (37.3 C) (Oral)  Resp 16  SpO2 98% Physical Exam  Constitutional: She is oriented to person, place, and time. She appears well-developed and well-nourished. No distress.  HENT:  Mouth/Throat: No oropharyngeal exudate.  Left TM is normal. Right TM is disrupted and is figured from previous  ear surgery. No drainage or discoloration. Oropharynx moist, mildly erythematous with scant amount of clear PND.  Eyes: Conjunctivae and EOM are normal.  Neck: Normal range of motion. Neck supple.  Cardiovascular: Normal rate, regular rhythm, normal heart sounds and intact distal pulses.   Pulmonary/Chest: Effort normal and breath sounds normal. No respiratory distress. She has no wheezes. She has no rales.  Lymphadenopathy:    She has no cervical adenopathy.  Neurological: She is alert and oriented to person, place, and time.  Skin: Skin is warm and dry.  Psychiatric: She has a normal mood and affect.  Nursing note and vitals reviewed.   ED Course  Procedures (including critical care time) Labs Review Labs Reviewed - No data to display  Imaging Review No results found.   MDM   1. Allergic rhinitis due to pollen   2. Cough   3. PND (post-nasal drip)    Kenalog 40 mg IM Prednisone low dose atrovent NS Recommend nondrowsy antihistamine such as Allegra, Zyrtec or Claritin. May also use nasal spray Rhinocort or Flonase Tylenol or Aleve as needed for aches and pains     Janne Napoleon, NP 06/01/14 1142

## 2014-06-01 NOTE — ED Notes (Signed)
C/o  Persistent cough.  Low grade temp.  Body aches off/on.  Chest congestion.  Sore throat.    Symptoms present x 1 wk.  No relief with otc meds.

## 2014-06-01 NOTE — Discharge Instructions (Signed)
Allergic Rhinitis Recommend nondrowsy antihistamine such as Allegra, Zyrtec or Claritin. May also use nasal spray Rhinocort or Flonase Tylenol or Aleve as needed for aches and pains Allergic rhinitis is when the mucous membranes in the nose respond to allergens. Allergens are particles in the air that cause your body to have an allergic reaction. This causes you to release allergic antibodies. Through a chain of events, these eventually cause you to release histamine into the blood stream. Although meant to protect the body, it is this release of histamine that causes your discomfort, such as frequent sneezing, congestion, and an itchy, runny nose.  CAUSES  Seasonal allergic rhinitis (hay fever) is caused by pollen allergens that may come from grasses, trees, and weeds. Year-round allergic rhinitis (perennial allergic rhinitis) is caused by allergens such as house dust mites, pet dander, and mold spores.  SYMPTOMS   Nasal stuffiness (congestion).  Itchy, runny nose with sneezing and tearing of the eyes. DIAGNOSIS  Your health care provider can help you determine the allergen or allergens that trigger your symptoms. If you and your health care provider are unable to determine the allergen, skin or blood testing may be used. TREATMENT  Allergic rhinitis does not have a cure, but it can be controlled by:  Medicines and allergy shots (immunotherapy).  Avoiding the allergen. Hay fever may often be treated with antihistamines in pill or nasal spray forms. Antihistamines block the effects of histamine. There are over-the-counter medicines that may help with nasal congestion and swelling around the eyes. Check with your health care provider before taking or giving this medicine.  If avoiding the allergen or the medicine prescribed do not work, there are many new medicines your health care provider can prescribe. Stronger medicine may be used if initial measures are ineffective. Desensitizing injections  can be used if medicine and avoidance does not work. Desensitization is when a patient is given ongoing shots until the body becomes less sensitive to the allergen. Make sure you follow up with your health care provider if problems continue. HOM Cough, Adult  A cough is a reflex. It helps you clear your throat and airways. A cough can help heal your body. A cough can last 2 or 3 weeks (acute) or may last more than 8 weeks (chronic). Some common causes of a cough can include an infection, allergy, or a cold. HOME CARE  Only take medicine as told by your doctor.  If given, take your medicines (antibiotics) as told. Finish them even if you start to feel better.  Use a cold steam vaporizer or humidifier in your home. This can help loosen thick spit (secretions).  Sleep so you are almost sitting up (semi-upright). Use pillows to do this. This helps reduce coughing.  Rest as needed.  Stop smoking if you smoke. GET HELP RIGHT AWAY IF:  You have yellowish-white fluid (pus) in your thick spit.  Your cough gets worse.  Your medicine does not reduce coughing, and you are losing sleep.  You cough up blood.  You have trouble breathing.  Your pain gets worse and medicine does not help.  You have a fever. MAKE SURE YOU:   Understand these instructions.  Will watch your condition.  Will get help right away if you are not doing well or get worse. Document Released: 10/22/2010 Document Revised: 06/25/2013 Document Reviewed: 10/22/2010 [facility] Patient Information 2015 [place], [place]. This information is not intended to replace advice given to you by your health care provider. Make sure you  discuss any questions you have with your health care provider. E CARE INSTRUCTIONS It is not possible to completely avoid allergens, but you can reduce your symptoms by taking steps to limit your exposure to them. It helps to know exactly what you are allergic to so that you can avoid your specific  triggers. SEEK MEDICAL CARE IF:   You have a fever.  You develop a cough that does not stop easily (persistent).  You have shortness of breath.  You start wheezing.  Symptoms interfere with normal daily activities. Document Released: 11/03/2000 Document Revised: 02/13/2013 Document Reviewed: 10/16/2012 [facility] Patient Information 2015 [place], [place]. This information is not intended to replace advice given to you by your health care provider. Make sure you discuss any questions you have with your health care provider.

## 2014-07-31 ENCOUNTER — Encounter (HOSPITAL_COMMUNITY): Payer: Self-pay | Admitting: *Deleted

## 2014-07-31 ENCOUNTER — Inpatient Hospital Stay (HOSPITAL_COMMUNITY)
Admission: EM | Admit: 2014-07-31 | Discharge: 2014-08-04 | DRG: 694 | Disposition: A | Discharge status: Home / Self Care | Payer: PPO | Attending: Urology | Admitting: Urology

## 2014-07-31 ENCOUNTER — Emergency Department (HOSPITAL_COMMUNITY)
Admission: EM | Admit: 2014-07-31 | Discharge: 2014-07-31 | Disposition: A | Discharge status: Nursing Home (Includes ICF) | Payer: PPO | Source: Home / Self Care | Attending: Emergency Medicine | Admitting: Emergency Medicine

## 2014-07-31 ENCOUNTER — Emergency Department (HOSPITAL_COMMUNITY): Payer: PPO

## 2014-07-31 ENCOUNTER — Encounter (HOSPITAL_COMMUNITY): Payer: Self-pay | Admitting: Emergency Medicine

## 2014-07-31 DIAGNOSIS — N132 Hydronephrosis with renal and ureteral calculous obstruction: Principal | ICD-10-CM | POA: Diagnosis present

## 2014-07-31 DIAGNOSIS — N136 Pyonephrosis: Secondary | ICD-10-CM | POA: Diagnosis present

## 2014-07-31 DIAGNOSIS — K5900 Constipation, unspecified: Secondary | ICD-10-CM | POA: Diagnosis present

## 2014-07-31 DIAGNOSIS — R0602 Shortness of breath: Secondary | ICD-10-CM

## 2014-07-31 DIAGNOSIS — R109 Unspecified abdominal pain: Secondary | ICD-10-CM | POA: Diagnosis not present

## 2014-07-31 DIAGNOSIS — E785 Hyperlipidemia, unspecified: Secondary | ICD-10-CM | POA: Diagnosis present

## 2014-07-31 DIAGNOSIS — N201 Calculus of ureter: Secondary | ICD-10-CM | POA: Diagnosis present

## 2014-07-31 DIAGNOSIS — G43909 Migraine, unspecified, not intractable, without status migrainosus: Secondary | ICD-10-CM | POA: Diagnosis present

## 2014-07-31 DIAGNOSIS — Z87442 Personal history of urinary calculi: Secondary | ICD-10-CM

## 2014-07-31 DIAGNOSIS — B9620 Unspecified Escherichia coli [E. coli] as the cause of diseases classified elsewhere: Secondary | ICD-10-CM | POA: Diagnosis present

## 2014-07-31 DIAGNOSIS — Z87891 Personal history of nicotine dependence: Secondary | ICD-10-CM

## 2014-07-31 DIAGNOSIS — K219 Gastro-esophageal reflux disease without esophagitis: Secondary | ICD-10-CM | POA: Diagnosis present

## 2014-07-31 DIAGNOSIS — N390 Urinary tract infection, site not specified: Secondary | ICD-10-CM

## 2014-07-31 LAB — POCT URINALYSIS DIP (DEVICE)
BILIRUBIN URINE: NEGATIVE
GLUCOSE, UA: NEGATIVE mg/dL
KETONES UR: NEGATIVE mg/dL
Nitrite: POSITIVE — AB
PH: 6 (ref 5.0–8.0)
Protein, ur: 100 mg/dL — AB
SPECIFIC GRAVITY, URINE: 1.025 (ref 1.005–1.030)
Urobilinogen, UA: 0.2 mg/dL (ref 0.0–1.0)

## 2014-07-31 LAB — URINE MICROSCOPIC-ADD ON

## 2014-07-31 LAB — CBC WITH DIFFERENTIAL/PLATELET
BASOS ABS: 0 10*3/uL (ref 0.0–0.1)
Basophils Relative: 0 % (ref 0–1)
EOS PCT: 0 % (ref 0–5)
Eosinophils Absolute: 0 10*3/uL (ref 0.0–0.7)
HCT: 31 % — ABNORMAL LOW (ref 36.0–46.0)
Hemoglobin: 10 g/dL — ABNORMAL LOW (ref 12.0–15.0)
LYMPHS PCT: 6 % — AB (ref 12–46)
Lymphs Abs: 0.8 10*3/uL (ref 0.7–4.0)
MCH: 26.7 pg (ref 26.0–34.0)
MCHC: 32.3 g/dL (ref 30.0–36.0)
MCV: 82.9 fL (ref 78.0–100.0)
MONO ABS: 0.3 10*3/uL (ref 0.1–1.0)
Monocytes Relative: 3 % (ref 3–12)
NEUTROS ABS: 12.1 10*3/uL — AB (ref 1.7–7.7)
NEUTROS PCT: 91 % — AB (ref 43–77)
PLATELETS: 323 10*3/uL (ref 150–400)
RBC: 3.74 MIL/uL — AB (ref 3.87–5.11)
RDW: 16.1 % — ABNORMAL HIGH (ref 11.5–15.5)
WBC: 13.3 10*3/uL — ABNORMAL HIGH (ref 4.0–10.5)

## 2014-07-31 LAB — COMPREHENSIVE METABOLIC PANEL
ALBUMIN: 3.6 g/dL (ref 3.5–5.0)
ALT: 12 U/L — ABNORMAL LOW (ref 14–54)
AST: 16 U/L (ref 15–41)
Alkaline Phosphatase: 52 U/L (ref 38–126)
Anion gap: 9 (ref 5–15)
BUN: 20 mg/dL (ref 6–20)
CO2: 21 mmol/L — AB (ref 22–32)
Calcium: 8.9 mg/dL (ref 8.9–10.3)
Chloride: 109 mmol/L (ref 101–111)
Creatinine, Ser: 0.76 mg/dL (ref 0.44–1.00)
GFR calc Af Amer: >60 mL/min (ref >60)
GLUCOSE: 114 mg/dL — AB (ref 65–99)
Potassium: 3.7 mmol/L (ref 3.5–5.1)
Sodium: 139 mmol/L (ref 135–145)
TOTAL PROTEIN: 6.2 g/dL — AB (ref 6.5–8.1)
Total Bilirubin: 0.5 mg/dL (ref 0.3–1.2)

## 2014-07-31 LAB — URINALYSIS, ROUTINE W REFLEX MICROSCOPIC
BILIRUBIN URINE: NEGATIVE
GLUCOSE, UA: NEGATIVE mg/dL
Ketones, ur: 15 mg/dL — AB
NITRITE: POSITIVE — AB
Protein, ur: NEGATIVE mg/dL
SPECIFIC GRAVITY, URINE: 1.015 (ref 1.005–1.030)
UROBILINOGEN UA: 0.2 mg/dL (ref 0.0–1.0)
pH: 6 (ref 5.0–8.0)

## 2014-07-31 LAB — LIPASE, BLOOD: Lipase: 42 U/L (ref 22–51)

## 2014-07-31 MED ORDER — HYDROCODONE-ACETAMINOPHEN 5-325 MG PO TABS
2.0000 | ORAL_TABLET | Freq: Once | ORAL | Status: AC
  Administered 2014-07-31: 2 via ORAL

## 2014-07-31 MED ORDER — HYDROMORPHONE HCL 1 MG/ML IJ SOLN
1.0000 mg | INTRAMUSCULAR | Status: DC | PRN
  Administered 2014-07-31: 1 mg via INTRAVENOUS
  Filled 2014-07-31: qty 1

## 2014-07-31 MED ORDER — DEXTROSE 5 % IV SOLN
1.0000 g | Freq: Once | INTRAVENOUS | Status: AC
  Administered 2014-07-31: 1 g via INTRAVENOUS
  Filled 2014-07-31: qty 10

## 2014-07-31 MED ORDER — MORPHINE SULFATE 2 MG/ML IJ SOLN
INTRAMUSCULAR | Status: AC
  Filled 2014-07-31: qty 1

## 2014-07-31 MED ORDER — MORPHINE SULFATE 2 MG/ML IJ SOLN
2.0000 mg | Freq: Once | INTRAMUSCULAR | Status: AC
  Administered 2014-07-31: 2 mg via INTRAVENOUS

## 2014-07-31 MED ORDER — HYDROCODONE-ACETAMINOPHEN 5-325 MG PO TABS
ORAL_TABLET | ORAL | Status: AC
  Filled 2014-07-31: qty 2

## 2014-07-31 MED ORDER — ONDANSETRON 4 MG PO TBDP
4.0000 mg | ORAL_TABLET | Freq: Once | ORAL | Status: AC
  Administered 2014-07-31: 4 mg via ORAL

## 2014-07-31 MED ORDER — ONDANSETRON 4 MG PO TBDP
ORAL_TABLET | ORAL | Status: AC
  Filled 2014-07-31: qty 2

## 2014-07-31 MED ORDER — ONDANSETRON HCL 4 MG/2ML IJ SOLN
4.0000 mg | Freq: Once | INTRAMUSCULAR | Status: AC
Start: 2014-07-31 — End: 2014-07-31
  Administered 2014-07-31: 4 mg via INTRAVENOUS
  Filled 2014-07-31: qty 2

## 2014-07-31 MED ORDER — SODIUM CHLORIDE 0.9 % IV SOLN
1000.0000 mL | INTRAVENOUS | Status: DC
  Administered 2014-07-31: 1000 mL via INTRAVENOUS

## 2014-07-31 MED ORDER — HYDROCODONE-ACETAMINOPHEN 5-325 MG PO TABS
ORAL_TABLET | ORAL | Status: AC
  Filled 2014-07-31: qty 1

## 2014-07-31 NOTE — ED Provider Notes (Signed)
CSN: [id number]     Arrival date & time 07/31/14  1828 History   First MD Initiated Contact with Patient 07/31/14 1934     Chief Complaint  Patient presents with  . Back Pain   (Consider location/radiation/quality/duration/timing/severity/associated sxs/prior Treatment) HPI  She is a 69 year old woman here for evaluation of left side pain. This started acutely at 2 PM. It is associated with nausea and vomiting. She states it is a constant pain. She states it has moved slightly lower over the last several hours. She reports some difficulty urinating, but denies dysuria or hematuria. She has had a kidney stone in the past and states this feels similar. No fevers. She states her mouth feels dry. She has not been able to tolerate food or liquids. She has not tried any medications.  Past Medical History  Diagnosis Date  . Depression   . Hyperlipidemia   . Renal calculi     RIGHT  . Mild acid reflux   . Frequency of urination   . Urgency of urination   . Hematuria   . Arthritis   . Anemia    Past Surgical History  Procedure Laterality Date  . Inner ear surgery Right yrs ago  . Wrist surgery Right 2010  . Cystoscopy with stent placement Right 08/20/2012    Procedure: CYSTOSCOPY WITH STENT PLACEMENT;  Surgeon: Franchot Gallo, MD;  Location: [facility];  Service: Urology;  Laterality: Right;  . Knee arthroscopy Left 2012  . Necklift    . Cystoscopy with ureteroscopy, stone basketry and stent placement Right 09/21/2012    Procedure: CYSTOSCOPY WITH URETEROSCOPY, Retrograde Pyelogram;  Surgeon: Franchot Gallo, MD;  Location: [facility];  Service: Urology;  Laterality: Right;   Family History  Problem Relation Age of Onset  . ADD / ADHD Neg Hx   . Alcohol abuse Neg Hx   . Anxiety disorder Neg Hx   . Bipolar disorder Neg Hx   . Dementia Neg Hx   . Depression Neg Hx   . Drug abuse Neg Hx   . Schizophrenia Neg Hx   . Suicidality Neg Hx    History  Substance Use Topics   . Smoking status: Former Smoker -- 0.50 packs/day for 10 years    Types: Cigarettes    Quit date: 09/15/1978  . Smokeless tobacco: Never Used  . Alcohol Use: No   OB History    No data available     Review of Systems As in history of present illness Allergies  Statins  Home Medications   Prior to Admission medications   Medication Sig Start Date End Date Taking? Authorizing Provider  busPIRone (BUSPAR) 10 MG tablet Take 2 tablets (20 mg total) by mouth 2 (two) times daily. 01/29/14  Yes Charlcie Cradle, MD  ezetimibe (ZETIA) 10 MG tablet Take 1 tablet (10 mg total) by mouth daily. 06/05/13  Yes Milta Deiters T Mashburn, PA-C  sertraline (ZOLOFT) 100 MG tablet Take 1 tablet (100 mg total) by mouth daily. 01/29/14  Yes Charlcie Cradle, MD  ferrous sulfate 325 (65 FE) MG tablet Take 325 mg by mouth daily with breakfast.    Historical Provider, MD  guaiFENesin-codeine (CHERATUSSIN AC) 100-10 MG/5ML syrup Take 5 mLs by mouth every 4 (four) hours as needed for cough or congestion. 06/01/14   Janne Napoleon, NP  ipratropium (ATROVENT) 0.06 % nasal spray Place 2 sprays into both nostrils 4 (four) times daily. 06/01/14   Janne Napoleon, NP  predniSONE (DELTASONE) 20 MG tablet Take  2 tabs po on first day, 2 tabs second day, 1 tabs third day, 1 tab fourth day, 1 tab 5th day. Take with food. 06/01/14   Janne Napoleon, NP   BP 145/94 mmHg  Pulse 60  Temp(Src) 98.2 F (36.8 C) (Oral)  Resp 18  SpO2 100% Physical Exam  Constitutional: She is oriented to person, place, and time. She appears well-developed and well-nourished. She appears distressed (looks very uncomfortable).  Neck: Neck supple.  Cardiovascular: Normal rate.   Pulmonary/Chest: Effort normal.  Abdominal: Soft. Bowel sounds are normal. She exhibits no distension. There is no tenderness. There is no rebound and no guarding.  Positive left CVA tenderness  Musculoskeletal:  Back: No erythema or edema. No vertebral tenderness. She is tender in the left lower  back.  Neurological: She is alert and oriented to person, place, and time.    ED Course  Procedures (including critical care time) Labs Review Labs Reviewed  POCT URINALYSIS DIP (DEVICE) - Abnormal; Notable for the following:    Hgb urine dipstick LARGE (*)    Protein, ur 100 (*)    Nitrite POSITIVE (*)    Leukocytes, UA LARGE (*)    All other components within normal limits    Imaging Review No results found.   MDM   1. Left flank pain    Norco 5-325 milligrams 2 tablets given. Zofran 4 mg ODT given.  Urine is consistent with a kidney stone as well as infection. After the medication she states her nausea is improved, but she continues to have significant pain. She continues to writhe on the exam table. Discussed options with her including attempting outpatient management versus transfer to the emergency room. Given her degree of pain after 2 Norco, we'll transfer to the emergency room via EMS. We started an IV and gave 2 mg of morphine IV.  Melony Overly, MD 07/31/14 2011

## 2014-07-31 NOTE — ED Notes (Signed)
C/o  Left flank/back pain.  Acute on set today around noon.  Hx of kidney stones.  Denies hematuria.  n/v

## 2014-07-31 NOTE — ED Notes (Signed)
Pt in via [facility] from urgent care for further evaluation of left flank pain, pt given pain and nausea medication at urgent care, alert and oriented

## 2014-07-31 NOTE — ED Provider Notes (Signed)
CSN: [id number]     Arrival date & time 07/31/14  2105 History   First MD Initiated Contact with Patient 07/31/14 2119     Chief Complaint  Patient presents with  . Flank Pain    HPI Patient was sent from the urgent care for evaluation of persistent severe left-sided flank pain. Started having pain around 2 PM today. She started having nausea and vomiting. Pain was constant and increasing in severity. Over the last several hours pain started migrating towards her lower abdomen. She's had some difficulty with urinating but no hematuria. She does have a history of kidney stones and this pain feels similar to that. At the urgent care she was giving a dose of pain medications and was noted to have an abnormal dip stick UA. She was sent to the emergency room for further evaluation because of her persistent pain and discomfort. Past Medical History  Diagnosis Date  . Depression   . Hyperlipidemia   . Renal calculi     RIGHT  . Mild acid reflux   . Frequency of urination   . Urgency of urination   . Hematuria   . Arthritis   . Anemia    Past Surgical History  Procedure Laterality Date  . Inner ear surgery Right yrs ago  . Wrist surgery Right 2010  . Cystoscopy with stent placement Right 08/20/2012    Procedure: CYSTOSCOPY WITH STENT PLACEMENT;  Surgeon: Franchot Gallo, MD;  Location: [facility];  Service: Urology;  Laterality: Right;  . Knee arthroscopy Left 2012  . Necklift    . Cystoscopy with ureteroscopy, stone basketry and stent placement Right 09/21/2012    Procedure: CYSTOSCOPY WITH URETEROSCOPY, Retrograde Pyelogram;  Surgeon: Franchot Gallo, MD;  Location: [facility];  Service: Urology;  Laterality: Right;   Family History  Problem Relation Age of Onset  . ADD / ADHD Neg Hx   . Alcohol abuse Neg Hx   . Anxiety disorder Neg Hx   . Bipolar disorder Neg Hx   . Dementia Neg Hx   . Depression Neg Hx   . Drug abuse Neg Hx   . Schizophrenia Neg Hx   . Suicidality  Neg Hx    History  Substance Use Topics  . Smoking status: Former Smoker -- 0.50 packs/day for 10 years    Types: Cigarettes    Quit date: 09/15/1978  . Smokeless tobacco: Never Used  . Alcohol Use: No   OB History    No data available     Review of Systems  All other systems reviewed and are negative.     Allergies  Statins  Home Medications   Prior to Admission medications   Medication Sig Start Date End Date Taking? Authorizing Provider  busPIRone (BUSPAR) 10 MG tablet Take 2 tablets (20 mg total) by mouth 2 (two) times daily. 01/29/14  Yes Charlcie Cradle, MD  ezetimibe (ZETIA) 10 MG tablet Take 1 tablet (10 mg total) by mouth daily. 06/05/13  Yes Milta Deiters T Mashburn, PA-C  sertraline (ZOLOFT) 100 MG tablet Take 1 tablet (100 mg total) by mouth daily. 01/29/14  Yes Charlcie Cradle, MD  guaiFENesin-codeine [facility]) 100-10 MG/5ML syrup Take 5 mLs by mouth every 4 (four) hours as needed for cough or congestion. Patient not taking: Reported on 07/31/2014 06/01/14   Janne Napoleon, NP  ipratropium (ATROVENT) 0.06 % nasal spray Place 2 sprays into both nostrils 4 (four) times daily. Patient not taking: Reported on 07/31/2014 06/01/14   Shanon Brow  Mabe, NP  predniSONE (DELTASONE) 20 MG tablet Take 2 tabs po on first day, 2 tabs second day, 1 tabs third day, 1 tab fourth day, 1 tab 5th day. Take with food. Patient not taking: Reported on 07/31/2014 06/01/14   Janne Napoleon, NP   BP 110/63 mmHg  Pulse 85  SpO2 93% Physical Exam  Constitutional: No distress.  HENT:  Head: Normocephalic and atraumatic.  Right Ear: External ear normal.  Left Ear: External ear normal.  Eyes: Conjunctivae are normal. Right eye exhibits no discharge. Left eye exhibits no discharge. No scleral icterus.  Neck: Neck supple. No tracheal deviation present.  Cardiovascular: Normal rate, regular rhythm and intact distal pulses.   Pulmonary/Chest: Effort normal and breath sounds normal. No stridor. No respiratory distress. She  has no wheezes. She has no rales.  Abdominal: Soft. Bowel sounds are normal. She exhibits no distension. There is no tenderness. There is CVA tenderness (left). There is no rebound and no guarding.  Musculoskeletal: She exhibits no edema or tenderness.  Neurological: She is alert. She has normal strength. No cranial nerve deficit (no facial droop, extraocular movements intact, no slurred speech) or sensory deficit. She exhibits normal muscle tone. She displays no seizure activity. Coordination normal.  Skin: Skin is warm and dry. No rash noted. She is not diaphoretic.  Psychiatric: She has a normal mood and affect.  Nursing note and vitals reviewed.   ED Course  Procedures (including critical care time) Labs Review Labs Reviewed  CBC WITH DIFFERENTIAL/PLATELET - Abnormal; Notable for the following:    WBC 13.3 (*)    RBC 3.74 (*)    Hemoglobin 10.0 (*)    HCT 31.0 (*)    RDW 16.1 (*)    Neutrophils Relative % 91 (*)    Neutro Abs 12.1 (*)    Lymphocytes Relative 6 (*)    All other components within normal limits  COMPREHENSIVE METABOLIC PANEL - Abnormal; Notable for the following:    CO2 21 (*)    Glucose, Bld 114 (*)    Total Protein 6.2 (*)    ALT 12 (*)    All other components within normal limits  URINALYSIS, ROUTINE W REFLEX MICROSCOPIC (NOT AT [facility]) - Abnormal; Notable for the following:    APPearance CLOUDY (*)    Hgb urine dipstick LARGE (*)    Ketones, ur 15 (*)    Nitrite POSITIVE (*)    Leukocytes, UA MODERATE (*)    All other components within normal limits  URINE MICROSCOPIC-ADD ON - Abnormal; Notable for the following:    Bacteria, UA MANY (*)    All other components within normal limits  URINE CULTURE  LIPASE, BLOOD    Imaging Review Ct Abdomen Pelvis Wo Contrast  07/31/2014   CLINICAL DATA:  Acute onset of left-sided pain at 2 p.m. with nausea and vomiting.  EXAM: CT ABDOMEN AND PELVIS WITHOUT CONTRAST  TECHNIQUE: Multidetector CT imaging of the abdomen and  pelvis was performed following the standard protocol without IV contrast.  COMPARISON:  08/20/2012  FINDINGS: There is marked hydronephrosis and hydroureter on the left due to an obstructing 4 x 6 mm calculus at the left ureteral orifice.  There are numerous collecting system calculi in both kidneys. On the left, there are over 25 collecting system calculi, the largest measuring 6.7 mm. On the right, there are more than 15 calculi measuring from 2-4 mm.  No other acute abnormalities are evident in the abdomen or pelvis.  There  is a large hiatal hernia with most of the stomach above the diaphragm. Small bowel is unremarkable. Colon is remarkable only for uncomplicated moderate diverticulosis.  There are unremarkable unenhanced appearances of the liver, spleen, pancreas, adrenals and gallbladder.  The abdominal aorta is normal in caliber with mild atherosclerotic calcification.  Uterus and ovaries appear unremarkable.  There is no ascites.  There is no adenopathy.  No significant musculoskeletal lesion is evident. Incidental note is made of moderate degenerative disc changes, greatest at L2-3 and at L4-5.  IMPRESSION: 1. Obstructing 4 x 6 mm calculus at the left ureteral orifice with marked hydroureter and hydronephrosis. 2. Bilateral nephrolithiasis 3. Large hiatal hernia 4. Diverticulosis 5. Lumbar degenerative disc changes   Electronically Signed   By: Andreas Newport M.D.   On: 07/31/2014 23:28   Medications  HYDROmorphone (DILAUDID) injection 1 mg (1 mg Intravenous Given 07/31/14 2148)  0.9 %  sodium chloride infusion (1,000 mLs Intravenous New Bag/Given 07/31/14 2148)  cefTRIAXone (ROCEPHIN) 1 g in dextrose 5 % 50 mL IVPB (1 g Intravenous New Bag/Given 07/31/14 2323)  ondansetron (ZOFRAN) injection 4 mg (4 mg Intravenous Given 07/31/14 2148)     MDM   Final diagnoses:  Ureterolithiasis  UTI (lower urinary tract infection)    Patient's CAT scan confirms a 4 x 6 mm calculus at the left ureteral orifice.  The patient has marked hydroureter and hydronephrosis. This finding is complicated by a urinary tract infection. The patient was given 1 g of Rocephin IV. Urine cultures were sent off for further analysis. I spoke with Dr. Diona Fanti. He will come and evaluate the patient in the ED to discuss further treatment.    Dorie Rank, MD 07/31/14 2352

## 2014-08-01 ENCOUNTER — Other Ambulatory Visit (HOSPITAL_COMMUNITY): Payer: PPO

## 2014-08-01 ENCOUNTER — Emergency Department (HOSPITAL_COMMUNITY): Payer: PPO

## 2014-08-01 ENCOUNTER — Encounter (HOSPITAL_COMMUNITY): Payer: Self-pay | Admitting: General Surgery

## 2014-08-01 ENCOUNTER — Emergency Department (HOSPITAL_COMMUNITY): Payer: PPO | Admitting: Anesthesiology

## 2014-08-01 ENCOUNTER — Encounter (HOSPITAL_COMMUNITY)
Admission: EM | Disposition: A | Discharge status: Home / Self Care | Payer: Self-pay | Source: Home / Self Care | Attending: Urology

## 2014-08-01 DIAGNOSIS — N201 Calculus of ureter: Secondary | ICD-10-CM | POA: Diagnosis present

## 2014-08-01 HISTORY — PX: CYSTOSCOPY WITH STENT PLACEMENT: SHX5790

## 2014-08-01 LAB — CBC
HCT: 29.1 % — ABNORMAL LOW (ref 36.0–46.0)
Hemoglobin: 8.9 g/dL — ABNORMAL LOW (ref 12.0–15.0)
MCH: 25.7 pg — AB (ref 26.0–34.0)
MCHC: 30.6 g/dL (ref 30.0–36.0)
MCV: 84.1 fL (ref 78.0–100.0)
Platelets: 321 10*3/uL (ref 150–400)
RBC: 3.46 MIL/uL — AB (ref 3.87–5.11)
RDW: 16.4 % — ABNORMAL HIGH (ref 11.5–15.5)
WBC: 18 10*3/uL — ABNORMAL HIGH (ref 4.0–10.5)

## 2014-08-01 LAB — CREATININE, SERUM: CREATININE: 0.74 mg/dL (ref 0.44–1.00)

## 2014-08-01 SURGERY — CYSTOSCOPY, WITH STENT INSERTION
Anesthesia: General | Site: Urethra | Laterality: Left

## 2014-08-01 MED ORDER — PROMETHAZINE HCL 25 MG/ML IJ SOLN
INTRAMUSCULAR | Status: AC
  Filled 2014-08-01: qty 1

## 2014-08-01 MED ORDER — PROMETHAZINE HCL 25 MG/ML IJ SOLN
6.2500 mg | INTRAMUSCULAR | Status: DC | PRN
  Administered 2014-08-01: 6.25 mg via INTRAVENOUS

## 2014-08-01 MED ORDER — SUCCINYLCHOLINE CHLORIDE 20 MG/ML IJ SOLN
INTRAMUSCULAR | Status: DC | PRN
  Administered 2014-08-01: 100 mg via INTRAVENOUS

## 2014-08-01 MED ORDER — BUSPIRONE HCL 10 MG PO TABS
20.0000 mg | ORAL_TABLET | Freq: Two times a day (BID) | ORAL | Status: DC
  Administered 2014-08-01 – 2014-08-04 (×7): 20 mg via ORAL
  Filled 2014-08-01 (×8): qty 2

## 2014-08-01 MED ORDER — HYDROMORPHONE HCL 1 MG/ML IJ SOLN
0.5000 mg | INTRAMUSCULAR | Status: DC | PRN
  Administered 2014-08-02 – 2014-08-03 (×3): 1 mg via INTRAVENOUS
  Filled 2014-08-01 (×2): qty 1

## 2014-08-01 MED ORDER — CEFTRIAXONE SODIUM 500 MG IJ SOLR
500.0000 mg | INTRAMUSCULAR | Status: DC
  Administered 2014-08-01 – 2014-08-02 (×2): 500 mg via INTRAVENOUS
  Filled 2014-08-01 (×4): qty 500

## 2014-08-01 MED ORDER — LIDOCAINE HCL (CARDIAC) 20 MG/ML IV SOLN
INTRAVENOUS | Status: AC
Start: 2014-08-01 — End: 2014-08-01
  Filled 2014-08-01: qty 5

## 2014-08-01 MED ORDER — DOCUSATE SODIUM 100 MG PO CAPS
100.0000 mg | ORAL_CAPSULE | Freq: Two times a day (BID) | ORAL | Status: DC
  Administered 2014-08-01 – 2014-08-04 (×7): 100 mg via ORAL
  Filled 2014-08-01 (×8): qty 1

## 2014-08-01 MED ORDER — LACTATED RINGERS IV SOLN
INTRAVENOUS | Status: DC | PRN
Start: 2014-08-01 — End: 2014-08-01
  Administered 2014-08-01: 01:00:00 via INTRAVENOUS

## 2014-08-01 MED ORDER — MIDAZOLAM HCL 2 MG/2ML IJ SOLN
INTRAMUSCULAR | Status: AC
  Filled 2014-08-01: qty 2

## 2014-08-01 MED ORDER — SODIUM CHLORIDE 0.45 % IV SOLN
INTRAVENOUS | Status: DC
  Administered 2014-08-01 – 2014-08-02 (×3): via INTRAVENOUS
  Administered 2014-08-03: 75 mL/h via INTRAVENOUS

## 2014-08-01 MED ORDER — FENTANYL CITRATE (PF) 250 MCG/5ML IJ SOLN
INTRAMUSCULAR | Status: AC
  Filled 2014-08-01: qty 5

## 2014-08-01 MED ORDER — ENOXAPARIN SODIUM 40 MG/0.4ML ~~LOC~~ SOLN
40.0000 mg | SUBCUTANEOUS | Status: DC
  Administered 2014-08-01 – 2014-08-04 (×4): 40 mg via SUBCUTANEOUS
  Filled 2014-08-01 (×6): qty 0.4

## 2014-08-01 MED ORDER — STERILE WATER FOR IRRIGATION IR SOLN
Status: DC | PRN
  Administered 2014-08-01: 3000 mL

## 2014-08-01 MED ORDER — ONDANSETRON HCL 4 MG/2ML IJ SOLN
4.0000 mg | INTRAMUSCULAR | Status: DC | PRN
  Administered 2014-08-01: 4 mg via INTRAVENOUS

## 2014-08-01 MED ORDER — ONDANSETRON HCL 4 MG/2ML IJ SOLN
INTRAMUSCULAR | Status: DC | PRN
  Administered 2014-08-01: 4 mg via INTRAVENOUS

## 2014-08-01 MED ORDER — OXYBUTYNIN CHLORIDE 5 MG PO TABS
5.0000 mg | ORAL_TABLET | Freq: Three times a day (TID) | ORAL | Status: DC | PRN
  Administered 2014-08-01 – 2014-08-04 (×2): 5 mg via ORAL
  Filled 2014-08-01 (×2): qty 1

## 2014-08-01 MED ORDER — PROPOFOL 10 MG/ML IV BOLUS
INTRAVENOUS | Status: AC
  Filled 2014-08-01: qty 20

## 2014-08-01 MED ORDER — SUCCINYLCHOLINE CHLORIDE 20 MG/ML IJ SOLN
INTRAMUSCULAR | Status: AC
  Filled 2014-08-01: qty 1

## 2014-08-01 MED ORDER — FENTANYL CITRATE (PF) 250 MCG/5ML IJ SOLN
INTRAMUSCULAR | Status: DC | PRN
  Administered 2014-08-01: 50 ug via INTRAVENOUS

## 2014-08-01 MED ORDER — PHENYLEPHRINE HCL 10 MG/ML IJ SOLN
INTRAMUSCULAR | Status: DC | PRN
  Administered 2014-08-01 (×2): 40 ug via INTRAVENOUS

## 2014-08-01 MED ORDER — EZETIMIBE 10 MG PO TABS
10.0000 mg | ORAL_TABLET | Freq: Every day | ORAL | Status: DC
  Administered 2014-08-01 – 2014-08-04 (×4): 10 mg via ORAL
  Filled 2014-08-01 (×5): qty 1

## 2014-08-01 MED ORDER — ACETAMINOPHEN 325 MG PO TABS
650.0000 mg | ORAL_TABLET | Freq: Four times a day (QID) | ORAL | Status: DC | PRN
  Administered 2014-08-01 – 2014-08-04 (×8): 650 mg via ORAL
  Filled 2014-08-01 (×8): qty 2

## 2014-08-01 MED ORDER — OXYCODONE HCL 5 MG PO TABS
5.0000 mg | ORAL_TABLET | ORAL | Status: DC | PRN
  Administered 2014-08-01 – 2014-08-03 (×2): 5 mg via ORAL
  Filled 2014-08-01 (×3): qty 1

## 2014-08-01 MED ORDER — SERTRALINE HCL 100 MG PO TABS
100.0000 mg | ORAL_TABLET | Freq: Every day | ORAL | Status: DC
  Administered 2014-08-01 – 2014-08-04 (×4): 100 mg via ORAL
  Filled 2014-08-01 (×4): qty 1

## 2014-08-01 MED ORDER — MIDAZOLAM HCL 2 MG/2ML IJ SOLN
2.0000 mg | Freq: Once | INTRAMUSCULAR | Status: AC
  Administered 2014-08-01: 2 mg via INTRAVENOUS

## 2014-08-01 MED ORDER — PROPOFOL 10 MG/ML IV BOLUS
INTRAVENOUS | Status: DC | PRN
  Administered 2014-08-01: 140 mg via INTRAVENOUS

## 2014-08-01 MED ORDER — HYDROMORPHONE HCL 1 MG/ML IJ SOLN: 0.2500 mg | INTRAMUSCULAR | Status: DC | PRN

## 2014-08-01 SURGICAL SUPPLY — 42 items
ADAPTER CATH URET PLST 4-6FR (CATHETERS) IMPLANT
ADPR CATH URET STRL DISP 4-6FR (CATHETERS)
APL SKNCLS STERI-STRIP NONHPOA (GAUZE/BANDAGES/DRESSINGS)
BAG DRAIN CYSTO-URO STER (DRAIN) ×2 IMPLANT
BAG DRAIN URO-CYSTO SKYTR STRL (DRAIN) ×2 IMPLANT
BAG DRN UROCATH (DRAIN) ×1
BAG URINE DRAINAGE (UROLOGICAL SUPPLIES) ×3 IMPLANT
BAG URO CATCHER STRL LF (DRAPE) ×3 IMPLANT
BASKET STONE NITINOL 3FR/115 (UROLOGICAL SUPPLIES) ×1 IMPLANT
BASKET STONE NITINOL 3FR/115CM (UROLOGICAL SUPPLIES) ×1
BENZOIN TINCTURE PRP APPL 2/3 (GAUZE/BANDAGES/DRESSINGS) IMPLANT
BLADE 10 SAFETY STRL DISP (BLADE) ×3 IMPLANT
BUCKET BIOHAZARD WASTE 5 GAL (MISCELLANEOUS) ×3 IMPLANT
CATH FOLEY 2WAY SLVR  5CC 16FR (CATHETERS)
CATH FOLEY 2WAY SLVR 5CC 16FR (CATHETERS) IMPLANT
CATH URET 5FR 28IN CONE TIP (BALLOONS)
CATH URET 5FR 70CM CONE TIP (BALLOONS) IMPLANT
DRAPE CAMERA CLOSED 9X96 (DRAPES) ×6 IMPLANT
GLOVE BIOGEL PI IND STRL 7.5 (GLOVE) IMPLANT
GLOVE BIOGEL PI INDICATOR 7.5 (GLOVE) ×2
GLOVE SURG SS PI 7.5 STRL IVOR (GLOVE) ×4 IMPLANT
GOWN STRL REUS W/ TWL LRG LVL3 (GOWN DISPOSABLE) ×1 IMPLANT
GOWN STRL REUS W/ TWL XL LVL3 (GOWN DISPOSABLE) ×1 IMPLANT
GOWN STRL REUS W/TWL LRG LVL3 (GOWN DISPOSABLE) ×3
GOWN STRL REUS W/TWL XL LVL3 (GOWN DISPOSABLE) ×3
GUIDEWIRE COOK  .035 (WIRE) IMPLANT
GUIDEWIRE STR DUAL SENSOR (WIRE) ×2 IMPLANT
KIT ROOM TURNOVER OR (KITS) ×3 IMPLANT
NS IRRIG 1000ML POUR BTL (IV SOLUTION) ×6 IMPLANT
PACK CYSTOSCOPY (CUSTOM PROCEDURE TRAY) ×3 IMPLANT
PAD ARMBOARD 7.5X6 YLW CONV (MISCELLANEOUS) ×6 IMPLANT
PLUG CATH AND CAP STER (CATHETERS) IMPLANT
STENT URET 6FRX24 CONTOUR (STENTS) ×2 IMPLANT
SYRINGE CONTROL L 12CC (SYRINGE) ×3 IMPLANT
SYRINGE CONTROL LL 12CC (SYRINGE) ×1 IMPLANT
SYRINGE TOOMEY DISP (SYRINGE) IMPLANT
TUBE CONNECTING 12'X1/4 (SUCTIONS) ×1
TUBE CONNECTING 12X1/4 (SUCTIONS) ×1 IMPLANT
UNDERPAD 30X30 INCONTINENT (UNDERPADS AND DIAPERS) ×3 IMPLANT
WATER STERILE IRR 1000ML POUR (IV SOLUTION) ×3 IMPLANT
WATER STERILE IRR 3000ML UROMA (IV SOLUTION) ×2 IMPLANT
WIRE COONS/BENSON .038X145CM (WIRE) IMPLANT

## 2014-08-01 NOTE — Transfer of Care (Signed)
Immediate Anesthesia Transfer of Care Note  Patient: Glenda Klein  Procedure(s) Performed: Procedure(s): CYSTOSCOPY WITH STENT PLACEMENT (Left)  Patient Location: PACU  Anesthesia Type:General  Level of Consciousness: awake, alert  and oriented  Airway & Oxygen Therapy: non-rebreather face mask  Post-op Assessment: Report given to RN, Post -op Vital signs reviewed and stable and Patient moving all extremities X 4  Post vital signs: Reviewed and stable  Last Vitals:  Filed Vitals:   08/01/14 0207  BP: 113/76  Pulse: 86  Temp:   Resp: 30    Complications: No apparent anesthesia complications

## 2014-08-01 NOTE — Progress Notes (Signed)
Pt appears to be less anxious.  When asked how she feels, if she is having chest pain anymore and she says she feels better that the medicine "helped."

## 2014-08-01 NOTE — Progress Notes (Signed)
[facility] is hooking pt up on stretcher in PACU.  Pt did go to the bathroom and was able to urinate but was nauseas and was administered antiemetics.  Pts belongings are in the stretcher with her.  Purse was locked up with security in [facility] and was transported already to [place].  RN pauline notified about all of the above and is ready to receive the pt.

## 2014-08-01 NOTE — Anesthesia Procedure Notes (Signed)
Procedure Name: Intubation Date/Time: 08/01/2014 1:24 AM Performed by: Valetta Fuller Pre-anesthesia Checklist: Patient identified, Emergency Drugs available, Suction available and Patient being monitored Patient Re-evaluated:Patient Re-evaluated prior to inductionOxygen Delivery Method: Circle system utilized Preoxygenation: Pre-oxygenation with 100% oxygen Intubation Type: IV induction, Rapid sequence and Cricoid Pressure applied Laryngoscope Size: Miller and 2 Grade View: Grade I Tube type: Oral Tube size: 7.5 mm Number of attempts: 1 Airway Equipment and Method: Stylet Placement Confirmation: ETT inserted through vocal cords under direct vision,  positive ETCO2 and breath sounds checked- equal and bilateral Secured at: 22 cm Tube secured with: Tape Dental Injury: Teeth and Oropharynx as per pre-operative assessment

## 2014-08-01 NOTE — Progress Notes (Signed)
MD Tobias Alexander notified pt states she is having chest pain.  Confirmed with MD that all vital signs are stable and within normal limits, EKG looks appropriate.  Pt appears to be anxious.  MD wants to administer Versed.  Will administer and continue to monitor and notify for further changes.

## 2014-08-01 NOTE — ED Notes (Signed)
Report called to OR  

## 2014-08-01 NOTE — Progress Notes (Signed)
Pt's BPs running from 76/51 to 92/54. Paged [facility]'s office. Awaiting orders. Will continue to monitor.

## 2014-08-01 NOTE — Anesthesia Preprocedure Evaluation (Signed)
Anesthesia Evaluation  Patient identified by MRN, date of birth, ID band Patient awake    Reviewed: Allergy & Precautions, NPO status , Patient's Chart, lab work & pertinent test results  History of Anesthesia Complications Negative for: history of anesthetic complications  Airway Mallampati: II  TM Distance: >3 FB Neck ROM: Full    Dental  (+) Teeth Intact   Pulmonary former smoker,    Pulmonary exam normal       Cardiovascular negative cardio ROS Normal cardiovascular exam    Neuro/Psych PSYCHIATRIC DISORDERS Depression negative neurological ROS     GI/Hepatic negative GI ROS, Neg liver ROS, GERD-  ,  Endo/Other  negative endocrine ROS  Renal/GU Renal disease  negative genitourinary   Musculoskeletal  (+) Arthritis -,   Abdominal   Peds negative pediatric ROS (+)  Hematology negative hematology ROS (+)   Anesthesia Other Findings   Reproductive/Obstetrics negative OB ROS                             Anesthesia Physical Anesthesia Plan  ASA: II and emergent  Anesthesia Plan: General   Post-op Pain Management:    Induction: Intravenous  Airway Management Planned: Oral ETT  Additional Equipment:   Intra-op Plan:   Post-operative Plan: Extubation in OR  Informed Consent: I have reviewed the patients History and Physical, chart, labs and discussed the procedure including the risks, benefits and alternatives for the proposed anesthesia with the patient or authorized representative who has indicated his/her understanding and acceptance.   Dental advisory given  Plan Discussed with: CRNA, Anesthesiologist and Surgeon  Anesthesia Plan Comments:         Anesthesia Quick Evaluation

## 2014-08-01 NOTE — Progress Notes (Signed)
Report given to [facility] provider who called to get information on patient.

## 2014-08-01 NOTE — Progress Notes (Signed)
Received a call back from Dr. Risa Grill. Since pt is asymptomatic, no need for a bolus at this time. No orders received. Will continue to monitor pt.

## 2014-08-01 NOTE — H&P (Signed)
H&P  Chief Complaint: Left flank pain  History of Present Illness: Glenda Klein is a 69 y.o. year old female with prior h/o uroithiassis who presented to the [facility] ER earlier this pm with a 2-3 day h/o LLQ/left flank pain, worsening today. She had a stone protocol CT which revealed a 3.3 by 6 mm left UVJ stone w/ significant left hydro. She was found to have a UTI and leukocytosis. She denies frequent UTI. She has had recent shakes and chills but no specific fever. She has also had recent LUTS.  Past Medical History  Diagnosis Date  . Depression   . Hyperlipidemia   . Renal calculi     RIGHT  . Mild acid reflux   . Frequency of urination   . Urgency of urination   . Hematuria   . Arthritis   . Anemia     Past Surgical History  Procedure Laterality Date  . Inner ear surgery Right yrs ago  . Wrist surgery Right 2010  . Cystoscopy with stent placement Right 08/20/2012    Procedure: CYSTOSCOPY WITH STENT PLACEMENT;  Surgeon: Franchot Gallo, MD;  Location: [facility];  Service: Urology;  Laterality: Right;  . Knee arthroscopy Left 2012  . Necklift    . Cystoscopy with ureteroscopy, stone basketry and stent placement Right 09/21/2012    Procedure: CYSTOSCOPY WITH URETEROSCOPY, Retrograde Pyelogram;  Surgeon: Franchot Gallo, MD;  Location: [facility];  Service: Urology;  Laterality: Right;    Home Medications:   (Not in a hospital admission)  Allergies:  Allergies  Allergen Reactions  . Statins Other (See Comments)    Extremity weakness    Family History  Problem Relation Age of Onset  . ADD / ADHD Neg Hx   . Alcohol abuse Neg Hx   . Anxiety disorder Neg Hx   . Bipolar disorder Neg Hx   . Dementia Neg Hx   . Depression Neg Hx   . Drug abuse Neg Hx   . Schizophrenia Neg Hx   . Suicidality Neg Hx     Social History:  reports that she quit smoking about 35 years ago. Her smoking use included Cigarettes. She has a 5 pack-year smoking history. She has never  used smokeless tobacco. She reports that she does not drink alcohol or use illicit drugs.  ROS: A complete review of systems was performed.  All systems are negative except for pertinent findings as noted.  Physical Exam:  Vital signs in last 24 hours: Temp:  [98.2 F (36.8 C)] 98.2 F (36.8 C) (06/08 1938) Pulse Rate:  [60-88] 88 (06/09 0000) Resp:  [18] 18 (06/08 1938) BP: (92-145)/(57-94) 92/62 mmHg (06/09 0000) SpO2:  [93 %-100 %] 97 % (06/09 0000) General:  Alert and oriented, No acute distress HEENT: Normocephalic, atraumatic Neck: No JVD or lymphadenopathy Cardiovascular: Regular rate and rhythm Lungs: Clear bilaterally Abdomen: Soft, no rebound/guarding. LLQ, LCVAT noted. Back: No CVA tenderness Extremities: No edema Neurologic: Grossly intact  Laboratory Data:  Results for orders placed or performed during the hospital encounter of 07/31/14 (from the past 24 hour(s))  Urinalysis, Routine w reflex microscopic (not at [facility])     Status: Abnormal   Collection Time: 07/31/14  9:38 PM  Result Value Ref Range   Color, Urine YELLOW YELLOW   APPearance CLOUDY (A) CLEAR   Specific Gravity, Urine 1.015 1.005 - 1.030   pH 6.0 5.0 - 8.0   Glucose, UA NEGATIVE NEGATIVE mg/dL   Hgb urine dipstick  LARGE (A) NEGATIVE   Bilirubin Urine NEGATIVE NEGATIVE   Ketones, ur 15 (A) NEGATIVE mg/dL   Protein, ur NEGATIVE NEGATIVE mg/dL   Urobilinogen, UA 0.2 0.0 - 1.0 mg/dL   Nitrite POSITIVE (A) NEGATIVE   Leukocytes, UA MODERATE (A) NEGATIVE  Urine microscopic-add on     Status: Abnormal   Collection Time: 07/31/14  9:38 PM  Result Value Ref Range   Squamous Epithelial / LPF RARE RARE   WBC, UA 21-50 <3 WBC/hpf   RBC / HPF 21-50 <3 RBC/hpf   Bacteria, UA MANY (A) RARE  CBC WITH DIFFERENTIAL     Status: Abnormal   Collection Time: 07/31/14  9:53 PM  Result Value Ref Range   WBC 13.3 (H) 4.0 - 10.5 K/uL   RBC 3.74 (L) 3.87 - 5.11 MIL/uL   Hemoglobin 10.0 (L) 12.0 - 15.0 g/dL   HCT  31.0 (L) 36.0 - 46.0 %   MCV 82.9 78.0 - 100.0 fL   MCH 26.7 26.0 - 34.0 pg   MCHC 32.3 30.0 - 36.0 g/dL   RDW 16.1 (H) 11.5 - 15.5 %   Platelets 323 150 - 400 K/uL   Neutrophils Relative % 91 (H) 43 - 77 %   Neutro Abs 12.1 (H) 1.7 - 7.7 K/uL   Lymphocytes Relative 6 (L) 12 - 46 %   Lymphs Abs 0.8 0.7 - 4.0 K/uL   Monocytes Relative 3 3 - 12 %   Monocytes Absolute 0.3 0.1 - 1.0 K/uL   Eosinophils Relative 0 0 - 5 %   Eosinophils Absolute 0.0 0.0 - 0.7 K/uL   Basophils Relative 0 0 - 1 %   Basophils Absolute 0.0 0.0 - 0.1 K/uL  Comprehensive metabolic panel     Status: Abnormal   Collection Time: 07/31/14  9:53 PM  Result Value Ref Range   Sodium 139 135 - 145 mmol/L   Potassium 3.7 3.5 - 5.1 mmol/L   Chloride 109 101 - 111 mmol/L   CO2 21 (L) 22 - 32 mmol/L   Glucose, Bld 114 (H) 65 - 99 mg/dL   BUN 20 6 - 20 mg/dL   Creatinine, Ser 0.76 0.44 - 1.00 mg/dL   Calcium 8.9 8.9 - 10.3 mg/dL   Total Protein 6.2 (L) 6.5 - 8.1 g/dL   Albumin 3.6 3.5 - 5.0 g/dL   AST 16 15 - 41 U/L   ALT 12 (L) 14 - 54 U/L   Alkaline Phosphatase 52 38 - 126 U/L   Total Bilirubin 0.5 0.3 - 1.2 mg/dL   GFR calc non Af Amer >60 >60 mL/min   GFR calc Af Amer >60 >60 mL/min   Anion gap 9 5 - 15  Lipase, blood     Status: None   Collection Time: 07/31/14  9:53 PM  Result Value Ref Range   Lipase 42 22 - 51 U/L   No results found for this or any previous visit (from the past 240 hour(s)). Creatinine:  Recent Labs  07/31/14 2153  CREATININE 0.76    Radiologic Imaging: Ct Abdomen Pelvis Wo Contrast  07/31/2014   CLINICAL DATA:  Acute onset of left-sided pain at 2 p.m. with nausea and vomiting.  EXAM: CT ABDOMEN AND PELVIS WITHOUT CONTRAST  TECHNIQUE: Multidetector CT imaging of the abdomen and pelvis was performed following the standard protocol without IV contrast.  COMPARISON:  08/20/2012  FINDINGS: There is marked hydronephrosis and hydroureter on the left due to an obstructing 4 x 6 mm  calculus  at the left ureteral orifice.  There are numerous collecting system calculi in both kidneys. On the left, there are over 25 collecting system calculi, the largest measuring 6.7 mm. On the right, there are more than 15 calculi measuring from 2-4 mm.  No other acute abnormalities are evident in the abdomen or pelvis.  There is a large hiatal hernia with most of the stomach above the diaphragm. Small bowel is unremarkable. Colon is remarkable only for uncomplicated moderate diverticulosis.  There are unremarkable unenhanced appearances of the liver, spleen, pancreas, adrenals and gallbladder.  The abdominal aorta is normal in caliber with mild atherosclerotic calcification.  Uterus and ovaries appear unremarkable.  There is no ascites.  There is no adenopathy.  No significant musculoskeletal lesion is evident. Incidental note is made of moderate degenerative disc changes, greatest at L2-3 and at L4-5.  IMPRESSION: 1. Obstructing 4 x 6 mm calculus at the left ureteral orifice with marked hydroureter and hydronephrosis. 2. Bilateral nephrolithiasis 3. Large hiatal hernia 4. Diverticulosis 5. Lumbar degenerative disc changes   Electronically Signed   By: Andreas Newport M.D.   On: 07/31/2014 23:28    Impression/Assessment:  Left distal ureteral stone with infected urine.  Plan:  I spoke w/ pt about urgent mgmt of her stone. I would recommend urgent cysto, J2 stent placement with possible basket extraction of the sone if possible. With UTI present, observation is not recommended due to possible sepsis in an obstructed renal unit. Pt agrees with the above plan. We will proceed asap.  Jorja Loa 08/01/2014, 12:26 AM  Lillette Boxer. Majd Tissue MD

## 2014-08-01 NOTE — ED Notes (Signed)
Admitting MD at bedside.

## 2014-08-01 NOTE — Op Note (Signed)
Preoperative diagnosis:Left ureteral stone with infection  Postoperative diagnosis:Same   Procedure:Cystoscopy, left J2 stent placement  (24 cm x 6 [place] contour without string)  Surgeon: Lillette Boxer. Verlyn Dannenberg, M.D.   Anesthesia: Gen.   Complications:None  Specimen(s): None  Drain(s):24 cm by 6 fr contour J2 stent w/o string  Indications:69 year old WF with prior h/o  Stone disease, presented to the ER with a 2-3 day h/o left flank pain. Found to have a UTI as well as an obstructing  Left distal ureteral calculus. Urgent intervention is recommended. She presents now for cysto, left J2 stent placement.   Technique and findings: The patient was properly identified in the holding area. She had been properly marked. She received 1 g of Rocephin prior to my presentation in the emergency room-this would suffice as preoperative IV antibody. She was taken the operating room where general endotracheal an aesthetic was administered. The patient was placed in the dorsolithotomy position. Genitalia and perineum were prepped and draped. Proper timeout was performed.  A 22 French panendoscope was advanced in her bladder which was inspected circumferentially. There were no tumors trabeculations or foreign bodies. Ureteral orifices were normal in configuration and location. The left ureteral orifice was cannulated with a 0.038 inch sensor-tip guidewire which was advanced under fluoroscopic guidance quite easily to the left renal pelvis where a curl was seen. I passed a nitinol basket up into the distal ureter. There was a calcification projected over top of the guidewire which I felt most likely was the ureteral stone. I tried several times to grasp this with the basket, this was unsuccessful. At this point, after several tries at basketing, I removed the basket and then over top of the guidewire using the cystoscope and fluoroscope, passed a 24 cm x 6 French contour double-J stent stent. The string had been  removed. The proximal and distal curls were seen after the guidewire was removed. Following this, the bladder was drained and the scope removed.  The patient tolerated procedure well. She was taken to the PACU in stable condition. She will be admitted for observation/IV antibody.

## 2014-08-01 NOTE — Anesthesia Postprocedure Evaluation (Signed)
Anesthesia Post Note  Patient: Glenda Klein  Procedure(s) Performed: Procedure(s) (LRB): CYSTOSCOPY WITH STENT PLACEMENT (Left)  Anesthesia type: general  Patient location: PACU  Post pain: Pain level controlled  Post assessment: Patient's Cardiovascular Status Stable  Post vital signs: Reviewed and stable  Level of consciousness: sedated  Complications: No apparent anesthesia complications

## 2014-08-02 ENCOUNTER — Encounter (HOSPITAL_COMMUNITY): Payer: Self-pay | Admitting: Urology

## 2014-08-02 DIAGNOSIS — E785 Hyperlipidemia, unspecified: Secondary | ICD-10-CM | POA: Diagnosis present

## 2014-08-02 DIAGNOSIS — N136 Pyonephrosis: Secondary | ICD-10-CM | POA: Diagnosis not present

## 2014-08-02 DIAGNOSIS — Z87442 Personal history of urinary calculi: Secondary | ICD-10-CM | POA: Diagnosis not present

## 2014-08-02 DIAGNOSIS — K5900 Constipation, unspecified: Secondary | ICD-10-CM | POA: Diagnosis not present

## 2014-08-02 DIAGNOSIS — R109 Unspecified abdominal pain: Secondary | ICD-10-CM | POA: Diagnosis present

## 2014-08-02 DIAGNOSIS — K219 Gastro-esophageal reflux disease without esophagitis: Secondary | ICD-10-CM | POA: Diagnosis not present

## 2014-08-02 DIAGNOSIS — G43909 Migraine, unspecified, not intractable, without status migrainosus: Secondary | ICD-10-CM | POA: Diagnosis present

## 2014-08-02 DIAGNOSIS — N132 Hydronephrosis with renal and ureteral calculous obstruction: Secondary | ICD-10-CM | POA: Diagnosis present

## 2014-08-02 DIAGNOSIS — Z87891 Personal history of nicotine dependence: Secondary | ICD-10-CM | POA: Diagnosis not present

## 2014-08-02 DIAGNOSIS — B9620 Unspecified Escherichia coli [E. coli] as the cause of diseases classified elsewhere: Secondary | ICD-10-CM | POA: Diagnosis present

## 2014-08-02 LAB — CBC
HEMATOCRIT: 29 % — AB (ref 36.0–46.0)
Hemoglobin: 9 g/dL — ABNORMAL LOW (ref 12.0–15.0)
MCH: 26.7 pg (ref 26.0–34.0)
MCHC: 31 g/dL (ref 30.0–36.0)
MCV: 86.1 fL (ref 78.0–100.0)
PLATELETS: 308 10*3/uL (ref 150–400)
RBC: 3.37 MIL/uL — ABNORMAL LOW (ref 3.87–5.11)
RDW: 17 % — AB (ref 11.5–15.5)
WBC: 13 10*3/uL — AB (ref 4.0–10.5)

## 2014-08-02 MED ORDER — OXYBUTYNIN CHLORIDE 5 MG PO TABS: 5.0000 mg | ORAL_TABLET | Freq: Three times a day (TID) | ORAL | Status: DC | PRN

## 2014-08-02 MED ORDER — SUMATRIPTAN SUCCINATE 25 MG PO TABS
25.0000 mg | ORAL_TABLET | Freq: Once | ORAL | Status: AC
  Administered 2014-08-02: 25 mg via ORAL
  Filled 2014-08-02: qty 1

## 2014-08-02 NOTE — Progress Notes (Signed)
1 Day Post-Op Subjective: Patient reports significant headache yesterday and today.  She had some chills during the night.  Objective: Vital signs in last 24 hours: Temp:  [98.3 F (36.8 C)-100.1 F (37.8 C)] 98.4 F (36.9 C) (06/10 1356) Pulse Rate:  [80-97] 97 (06/10 1356) Resp:  [16-18] 18 (06/10 1356) BP: (79-101)/(51-60) 91/60 mmHg (06/10 1523) SpO2:  [91 %-99 %] 97 % (06/10 1356)  Intake/Output from previous day: 06/09 0701 - 06/10 0700 In: 1920 [P.O.:120; I.V.:1800] Out: 2100 [Urine:2100] Intake/Output this shift: Total I/O In: 835 [P.O.:240; I.V.:595] Out: 1450 [Urine:1450]  Physical Exam:  Constitutional: Vital signs reviewed. WD WN in NAD   Eyes: PERRL, No scleral icterus.   Pulmonary/Chest: Normal effort : Extremities: No cyanosis or edema   Lab Results:  Recent Labs  07/31/14 2153 08/01/14 0517 08/02/14 0531  HGB 10.0* 8.9* 9.0*  HCT 31.0* 29.1* 29.0*   BMET  Recent Labs  07/31/14 2153 08/01/14 0517  NA 139  --   K 3.7  --   CL 109  --   CO2 21*  --   GLUCOSE 114*  --   BUN 20  --   CREATININE 0.76 0.74  CALCIUM 8.9  --    No results for input(s): LABPT, INR in the last 72 hours. No results for input(s): LABURIN in the last 72 hours. Results for orders placed or performed during the hospital encounter of 07/31/14  Urine culture     Status: None (Preliminary result)   Collection Time: 07/31/14  9:38 PM  Result Value Ref Range Status   Specimen Description URINE, RANDOM  Final   Special Requests NONE  Final   Colony Count   Final    >=100,000 COLONIES/ML Performed at [facility]    Culture   Final    ESCHERICHIA COLI Performed at [facility]    Report Status PENDING  Incomplete    Studies/Results: Ct Abdomen Pelvis Wo Contrast  07/31/2014   CLINICAL DATA:  Acute onset of left-sided pain at 2 p.m. with nausea and vomiting.  EXAM: CT ABDOMEN AND PELVIS WITHOUT CONTRAST  TECHNIQUE: Multidetector CT imaging of the  abdomen and pelvis was performed following the standard protocol without IV contrast.  COMPARISON:  08/20/2012  FINDINGS: There is marked hydronephrosis and hydroureter on the left due to an obstructing 4 x 6 mm calculus at the left ureteral orifice.  There are numerous collecting system calculi in both kidneys. On the left, there are over 25 collecting system calculi, the largest measuring 6.7 mm. On the right, there are more than 15 calculi measuring from 2-4 mm.  No other acute abnormalities are evident in the abdomen or pelvis.  There is a large hiatal hernia with most of the stomach above the diaphragm. Small bowel is unremarkable. Colon is remarkable only for uncomplicated moderate diverticulosis.  There are unremarkable unenhanced appearances of the liver, spleen, pancreas, adrenals and gallbladder.  The abdominal aorta is normal in caliber with mild atherosclerotic calcification.  Uterus and ovaries appear unremarkable.  There is no ascites.  There is no adenopathy.  No significant musculoskeletal lesion is evident. Incidental note is made of moderate degenerative disc changes, greatest at L2-3 and at L4-5.  IMPRESSION: 1. Obstructing 4 x 6 mm calculus at the left ureteral orifice with marked hydroureter and hydronephrosis. 2. Bilateral nephrolithiasis 3. Large hiatal hernia 4. Diverticulosis 5. Lumbar degenerative disc changes   Electronically Signed   By: Andreas Newport M.D.   On:  07/31/2014 23:28   Dg Chest 2 View  08/01/2014   CLINICAL DATA:  Flank and abdomen pain.  Some dyspnea.  EXAM: CHEST  2 VIEW  COMPARISON:  08/20/2012.  FINDINGS: There is a hiatal hernia. The lungs are clear. Pulmonary vasculature is normal. There are no pleural effusions. Hilar, mediastinal and cardiac contours are unremarkable and unchanged.  IMPRESSION: Hiatal hernia.  No acute cardiopulmonary findings.   Electronically Signed   By: Andreas Newport M.D.   On: 08/01/2014 01:41   Dg C-arm 1-60 Min-no Report  08/01/2014    CLINICAL DATA: infected kidney stone   C-ARM 1-60 MINUTES  Fluoroscopy was utilized by the requesting physician.  No radiographic  interpretation.     Assessment/Plan:   Pyelonephritis with left distal ureteral stone, status post stenting.  She is growing Escherichia coli, sensitivities pending.  Currently on Rocephin.  She is not ready to go home yet.  We will wait for final culture results.  If she has a fairly good day, I think she'll be fine to go home tomorrow.  Continue Rocephin for now.   LOS: 0 days   Glenda Klein M 08/02/2014, 6:06 PM

## 2014-08-02 NOTE — Progress Notes (Signed)
Urologist paged.  Resident Baltazar Najjar returned page and reported to him that patient oxygen decreased at rest in room air. On assessment patient lungs were diminished with rhonchi. DBC/IS and O2 @ 3L continuous applied to maintain O2 @ 95%. Reported also to Resident Baltazar Najjar that patient confided to the writer stating, " I don't think that I can any longer live by myself". A message was sent to the Education officer, museum. Resident Baltazar Najjar said that Dr Dorina Hoyer will see patient this morning.

## 2014-08-02 NOTE — Consult Note (Signed)
   [facility] CM Inpatient Consult   08/02/2014  Glenda Klein [birth year] [id number]   Referral received from inpatient [facility] stating patient wanted to learn more about [facility]. Called into room and spoke with patient to explain services. After discussion, decided that [facility] not appropriate for her at this time. Patient endorses she is not sure if she can go home and if she does, she needs more help. She also states " I have my house for sale and I have been under a lot of stress". Noted patient does have a psychiatric history. Call made to inpatient RNCM to make aware of patient's voiced concerns.   Marthenia Rolling, MSN-Ed, RN,BSN [facility] Liaison [phone]

## 2014-08-02 NOTE — Care Management Note (Signed)
Case Management Note  Patient Details  Name: NATALLIE RAVENSCROFT MRN: [id number] Date of Birth: 03-Sep-1945  Subjective/Objective: 69 y/o f admitted w/l ureteral stone.Noted 02 desats.POD#1 cystoscopy.From home.Patient wanted to know more about [place] their note.                Action/Plan:d/c plan home.   Expected Discharge Date:  08/02/14               Expected Discharge Plan:  Home/Self Care  In-House Referral:     Discharge planning Services  CM Consult  Post Acute Care Choice:    Choice offered to:     DME Arranged:    DME Agency:     HH Arranged:    HH Agency:     Status of Service:  In process, will continue to follow  Medicare Important Message Given:    Date Medicare IM Given:    Medicare IM give by:    Date Additional Medicare IM Given:    Additional Medicare Important Message give by:     If discussed at [place] of Stay Meetings, dates discussed:    Additional Comments:  Dessa Phi, RN 08/02/2014, 5:26 PM

## 2014-08-02 NOTE — Discharge Instructions (Signed)

## 2014-08-03 LAB — URINE CULTURE: Colony Count: >=100000

## 2014-08-03 MED ORDER — CIPROFLOXACIN HCL 500 MG PO TABS
500.0000 mg | ORAL_TABLET | Freq: Two times a day (BID) | ORAL | Status: DC
  Administered 2014-08-04 (×2): 500 mg via ORAL
  Filled 2014-08-03 (×4): qty 1

## 2014-08-03 MED ORDER — CEFTRIAXONE SODIUM IN DEXTROSE 20 MG/ML IV SOLN
1.0000 g | INTRAVENOUS | Status: DC
  Administered 2014-08-03: 1 g via INTRAVENOUS
  Filled 2014-08-03 (×2): qty 50

## 2014-08-03 NOTE — Progress Notes (Signed)
2 Days Post-Op Subjective: Patient reports chills, constipation. HA better. No stent pain.   Objective: Vital signs in last 24 hours: Temp:  [98.4 F (36.9 C)-99.5 F (37.5 C)] 99.3 F (37.4 C) (06/11 0620) Pulse Rate:  [63-97] 63 (06/11 0620) Resp:  [18] 18 (06/11 0620) BP: (79-112)/(51-72) 112/72 mmHg (06/11 0620) SpO2:  [92 %-97 %] 96 % (06/11 0620)  Intake/Output from previous day: 06/10 0701 - 06/11 0700 In: 1682.5 [P.O.:840; I.V.:792.5; IV Piggyback:50] Out: 3250 [Urine:3250] Intake/Output this shift:    Physical Exam:  NAD Abd - soft, NT Ext - no calf pain or swelling  Lab Results:  Recent Labs  07/31/14 2153 08/01/14 0517 08/02/14 0531  HGB 10.0* 8.9* 9.0*  HCT 31.0* 29.1* 29.0*   BMET  Recent Labs  07/31/14 2153 08/01/14 0517  NA 139  --   K 3.7  --   CL 109  --   CO2 21*  --   GLUCOSE 114*  --   BUN 20  --   CREATININE 0.76 0.74  CALCIUM 8.9  --    No results for input(s): LABPT, INR in the last 72 hours. No results for input(s): LABURIN in the last 72 hours. Results for orders placed or performed during the hospital encounter of 07/31/14  Urine culture     Status: None (Preliminary result)   Collection Time: 07/31/14  9:38 PM  Result Value Ref Range Status   Specimen Description URINE, RANDOM  Final   Special Requests NONE  Final   Colony Count   Final    >=100,000 COLONIES/ML Performed at [facility]    Culture   Final    ESCHERICHIA COLI Performed at [facility]    Report Status PENDING  Incomplete    Studies/Results: No results found.  Assessment/Plan: Left ureteral stone - s/p left stent, POD#2 Constipation - pt with colace, just drank 2 prune juice.  UTI - culture growing e coli  Sensitivity pending.    LOS: 1 day   Glenda Klein 08/03/2014, 12:56 PM

## 2014-08-04 LAB — CBC WITH DIFFERENTIAL/PLATELET
BASOS PCT: 0 % (ref 0–1)
Basophils Absolute: 0 10*3/uL (ref 0.0–0.1)
EOS ABS: 0.1 10*3/uL (ref 0.0–0.7)
EOS PCT: 2 % (ref 0–5)
HCT: 27.8 % — ABNORMAL LOW (ref 36.0–46.0)
Hemoglobin: 8.7 g/dL — ABNORMAL LOW (ref 12.0–15.0)
LYMPHS PCT: 17 % (ref 12–46)
Lymphs Abs: 1.1 10*3/uL (ref 0.7–4.0)
MCH: 26.3 pg (ref 26.0–34.0)
MCHC: 31.3 g/dL (ref 30.0–36.0)
MCV: 84 fL (ref 78.0–100.0)
MONO ABS: 0.5 10*3/uL (ref 0.1–1.0)
MONOS PCT: 9 % (ref 3–12)
Neutro Abs: 4.5 10*3/uL (ref 1.7–7.7)
Neutrophils Relative %: 72 % (ref 43–77)
Platelets: 277 10*3/uL (ref 150–400)
RBC: 3.31 MIL/uL — ABNORMAL LOW (ref 3.87–5.11)
RDW: 16.6 % — ABNORMAL HIGH (ref 11.5–15.5)
WBC: 6.2 10*3/uL (ref 4.0–10.5)

## 2014-08-04 LAB — BASIC METABOLIC PANEL
Anion gap: 8 (ref 5–15)
BUN: 8 mg/dL (ref 6–20)
CALCIUM: 8.7 mg/dL — AB (ref 8.9–10.3)
CO2: 25 mmol/L (ref 22–32)
Chloride: 107 mmol/L (ref 101–111)
Creatinine, Ser: 0.56 mg/dL (ref 0.44–1.00)
GFR calc non Af Amer: >60 mL/min (ref >60)
Glucose, Bld: 102 mg/dL — ABNORMAL HIGH (ref 65–99)
Potassium: 3.2 mmol/L — ABNORMAL LOW (ref 3.5–5.1)
Sodium: 140 mmol/L (ref 135–145)

## 2014-08-04 MED ORDER — PROMETHAZINE HCL 12.5 MG PO TABS: 12.5000 mg | ORAL_TABLET | Freq: Four times a day (QID) | ORAL | Status: DC | PRN

## 2014-08-04 MED ORDER — SUMATRIPTAN SUCCINATE 25 MG PO TABS
25.0000 mg | ORAL_TABLET | Freq: Once | ORAL | Status: AC
  Administered 2014-08-04: 25 mg via ORAL
  Filled 2014-08-04: qty 1

## 2014-08-04 MED ORDER — OXYCODONE-ACETAMINOPHEN 5-325 MG PO TABS: 1.0000 | ORAL_TABLET | Freq: Four times a day (QID) | ORAL | Status: DC | PRN

## 2014-08-04 MED ORDER — CIPROFLOXACIN HCL 500 MG PO TABS: 500.0000 mg | ORAL_TABLET | Freq: Two times a day (BID) | ORAL | Status: DC

## 2014-08-04 MED ORDER — SUMATRIPTAN SUCCINATE 25 MG PO TABS: 25.0000 mg | ORAL_TABLET | ORAL | Status: DC | PRN

## 2014-08-04 NOTE — Progress Notes (Signed)
Dr. Junious Silk aware pt frustrated and wants IVF's stopped. C/o having little sleep due to trips to [place]. See new orders to heplock IV. MD to see pt this am.

## 2014-08-04 NOTE — Progress Notes (Signed)
Pt's personal belongings obtained from security and returned to patient. Pt verified everything in place.

## 2014-08-04 NOTE — Progress Notes (Signed)
Assessment unchanged. Pt verbalized understanding of dc instructions through teach back. Scripts given as provided by MD. Understands what to expect and when to call urologist if any problems occur. Denies pain currently and ready for dc home. Discharged via wc to front entrance to meet awaiting vehicle to carry home. Accompanied by friend and NT.

## 2014-08-04 NOTE — Discharge Summary (Signed)
Physician Discharge Summary  Patient ID: Glenda Klein MRN: [id number] DOB/AGE: 29-Jul-1945 69 y.o.  Admit date: 07/31/2014 Discharge date: 08/04/2014  Admission Diagnoses: Left ureteral stone   Discharge Diagnoses:  Active Problems:   Left ureteral stone   Discharged Condition: good  Hospital Course: Patient admitted following cystoscopy left retrograde pyelogram and left ureteral stent placement for UTI and concern for sepsis. She is done well. She is remained stable. Urine culture grew Escherichia coli pansensitive. She was transitioned from Rocephin to Cipro. She's been ambulating well, tolerating a regular diet and had minimal pain. She has had some migraine headache which responded to Imitrex. She tells me she has a history of headaches in the past. She responded quickly to medication. She is stable for discharge postop day #3. Discussed with the patient stents are temporary and the importance of following up for definitive stone management.  Consults: None  Significant Diagnostic Studies: none  Treatments: surgery: Cystoscopy, left retrograde pyelogram, left ureteral stent placement  Discharge Exam: Blood pressure 132/75, pulse 65, temperature 98.3 F (36.8 C), temperature source Oral, resp. rate 18, height 5\' 4"  (1.626 m), weight 65.772 kg (145 lb), SpO2 96 %. No acute distress, sitting in bed Neurologic: Cranial nerves II through XII intact. Moving all extremities 4. No focal deficits. Cardiovascular-regular rate and rhythm Lungs-clear to auscultation bilaterally Abdomen-soft, nontender nondistended, good bowel sounds Mood-appropriate Extremities-no swelling, pain or edema  Disposition: Home      Medication List    STOP taking these medications        predniSONE 20 MG tablet  Commonly known as:  DELTASONE      TAKE these medications        busPIRone 10 MG tablet  Commonly known as:  BUSPAR  Take 2 tablets (20 mg total) by mouth 2 (two) times daily.     ciprofloxacin 500 MG tablet  Commonly known as:  CIPRO  Take 1 tablet (500 mg total) by mouth 2 (two) times daily.     ezetimibe 10 MG tablet  Commonly known as:  ZETIA  Take 1 tablet (10 mg total) by mouth daily.     guaiFENesin-codeine 100-10 MG/5ML syrup  Commonly known as:  CHERATUSSIN AC  Take 5 mLs by mouth every 4 (four) hours as needed for cough or congestion.     ipratropium 0.06 % nasal spray  Commonly known as:  ATROVENT  Place 2 sprays into both nostrils 4 (four) times daily.     oxybutynin 5 MG tablet  Commonly known as:  DITROPAN  Take 1 tablet (5 mg total) by mouth every 8 (eight) hours as needed for bladder spasms.     oxyCODONE-acetaminophen 5-325 MG per tablet  Commonly known as:  ROXICET  Take 1 tablet by mouth every 6 (six) hours as needed for severe pain.     promethazine 12.5 MG tablet  Commonly known as:  PHENERGAN  Take 1 tablet (12.5 mg total) by mouth every 6 (six) hours as needed for nausea or vomiting.     sertraline 100 MG tablet  Commonly known as:  ZOLOFT  Take 1 tablet (100 mg total) by mouth daily.           Follow-up Information    Follow up with DAHLSTEDT, Lillette Boxer, MD.   Specialty:  Urology   Why:  Call for appointment to be seen in 2-3 weeks   Contact information:   [place] [address] [phone]  SignedJunious Silk, Brian Kocourek 08/04/2014, 11:50 AM

## 2015-02-04 ENCOUNTER — Other Ambulatory Visit: Payer: Self-pay | Admitting: Radiology

## 2015-03-12 DIAGNOSIS — Z0000 Encounter for general adult medical examination without abnormal findings: Secondary | ICD-10-CM | POA: Diagnosis not present

## 2015-03-12 DIAGNOSIS — N200 Calculus of kidney: Secondary | ICD-10-CM | POA: Diagnosis not present

## 2015-04-16 DIAGNOSIS — H25813 Combined forms of age-related cataract, bilateral: Secondary | ICD-10-CM | POA: Diagnosis not present

## 2015-04-16 DIAGNOSIS — H43812 Vitreous degeneration, left eye: Secondary | ICD-10-CM | POA: Diagnosis not present

## 2015-04-16 DIAGNOSIS — H31002 Unspecified chorioretinal scars, left eye: Secondary | ICD-10-CM | POA: Diagnosis not present

## 2015-04-16 DIAGNOSIS — H02402 Unspecified ptosis of left eyelid: Secondary | ICD-10-CM | POA: Diagnosis not present

## 2015-05-14 DIAGNOSIS — M859 Disorder of bone density and structure, unspecified: Secondary | ICD-10-CM | POA: Diagnosis not present

## 2015-05-14 DIAGNOSIS — F39 Unspecified mood [affective] disorder: Secondary | ICD-10-CM | POA: Diagnosis not present

## 2015-05-14 DIAGNOSIS — Z131 Encounter for screening for diabetes mellitus: Secondary | ICD-10-CM | POA: Diagnosis not present

## 2015-05-14 DIAGNOSIS — G609 Hereditary and idiopathic neuropathy, unspecified: Secondary | ICD-10-CM | POA: Diagnosis not present

## 2015-05-14 DIAGNOSIS — E782 Mixed hyperlipidemia: Secondary | ICD-10-CM | POA: Diagnosis not present

## 2015-05-14 DIAGNOSIS — Z01419 Encounter for gynecological examination (general) (routine) without abnormal findings: Secondary | ICD-10-CM | POA: Diagnosis not present

## 2015-05-14 DIAGNOSIS — Z1389 Encounter for screening for other disorder: Secondary | ICD-10-CM | POA: Diagnosis not present

## 2015-05-14 DIAGNOSIS — Z0000 Encounter for general adult medical examination without abnormal findings: Secondary | ICD-10-CM | POA: Diagnosis not present

## 2015-05-14 DIAGNOSIS — R05 Cough: Secondary | ICD-10-CM | POA: Diagnosis not present

## 2015-05-14 DIAGNOSIS — D649 Anemia, unspecified: Secondary | ICD-10-CM | POA: Diagnosis not present

## 2015-05-19 DIAGNOSIS — H524 Presbyopia: Secondary | ICD-10-CM | POA: Diagnosis not present

## 2015-05-19 DIAGNOSIS — H43813 Vitreous degeneration, bilateral: Secondary | ICD-10-CM | POA: Diagnosis not present

## 2015-05-19 DIAGNOSIS — H25813 Combined forms of age-related cataract, bilateral: Secondary | ICD-10-CM | POA: Diagnosis not present

## 2015-05-19 DIAGNOSIS — H02402 Unspecified ptosis of left eyelid: Secondary | ICD-10-CM | POA: Diagnosis not present

## 2015-05-27 DIAGNOSIS — H2512 Age-related nuclear cataract, left eye: Secondary | ICD-10-CM | POA: Diagnosis not present

## 2015-05-27 DIAGNOSIS — H25812 Combined forms of age-related cataract, left eye: Secondary | ICD-10-CM | POA: Diagnosis not present

## 2015-05-27 DIAGNOSIS — H25012 Cortical age-related cataract, left eye: Secondary | ICD-10-CM | POA: Diagnosis not present

## 2015-05-27 DIAGNOSIS — H25032 Anterior subcapsular polar age-related cataract, left eye: Secondary | ICD-10-CM | POA: Diagnosis not present

## 2015-06-04 DIAGNOSIS — D649 Anemia, unspecified: Secondary | ICD-10-CM | POA: Diagnosis not present

## 2015-06-12 DIAGNOSIS — D649 Anemia, unspecified: Secondary | ICD-10-CM | POA: Diagnosis not present

## 2015-06-13 ENCOUNTER — Encounter (HOSPITAL_COMMUNITY): Payer: Self-pay | Admitting: Emergency Medicine

## 2015-06-13 ENCOUNTER — Ambulatory Visit (HOSPITAL_COMMUNITY)
Admission: EM | Admit: 2015-06-13 | Discharge: 2015-06-13 | Disposition: A | Discharge status: Home / Self Care | Payer: PPO | Attending: Emergency Medicine | Admitting: Emergency Medicine

## 2015-06-13 DIAGNOSIS — R350 Frequency of micturition: Secondary | ICD-10-CM | POA: Diagnosis not present

## 2015-06-13 DIAGNOSIS — Z9889 Other specified postprocedural states: Secondary | ICD-10-CM | POA: Diagnosis not present

## 2015-06-13 DIAGNOSIS — R509 Fever, unspecified: Secondary | ICD-10-CM | POA: Diagnosis not present

## 2015-06-13 DIAGNOSIS — Z87891 Personal history of nicotine dependence: Secondary | ICD-10-CM | POA: Insufficient documentation

## 2015-06-13 DIAGNOSIS — M1990 Unspecified osteoarthritis, unspecified site: Secondary | ICD-10-CM | POA: Diagnosis not present

## 2015-06-13 DIAGNOSIS — F329 Major depressive disorder, single episode, unspecified: Secondary | ICD-10-CM | POA: Diagnosis not present

## 2015-06-13 DIAGNOSIS — N390 Urinary tract infection, site not specified: Secondary | ICD-10-CM | POA: Diagnosis not present

## 2015-06-13 DIAGNOSIS — Z87442 Personal history of urinary calculi: Secondary | ICD-10-CM | POA: Insufficient documentation

## 2015-06-13 DIAGNOSIS — K219 Gastro-esophageal reflux disease without esophagitis: Secondary | ICD-10-CM | POA: Diagnosis not present

## 2015-06-13 DIAGNOSIS — E785 Hyperlipidemia, unspecified: Secondary | ICD-10-CM | POA: Insufficient documentation

## 2015-06-13 LAB — POCT URINALYSIS DIP (DEVICE)
Bilirubin Urine: NEGATIVE
Glucose, UA: NEGATIVE mg/dL
Ketones, ur: NEGATIVE mg/dL
NITRITE: POSITIVE — AB
SPECIFIC GRAVITY, URINE: 1.025 (ref 1.005–1.030)
Urobilinogen, UA: 0.2 mg/dL (ref 0.0–1.0)
pH: 6 (ref 5.0–8.0)

## 2015-06-13 MED ORDER — ACETAMINOPHEN 325 MG PO TABS
650.0000 mg | ORAL_TABLET | Freq: Once | ORAL | Status: AC
  Administered 2015-06-13: 650 mg via ORAL

## 2015-06-13 MED ORDER — CEFTRIAXONE SODIUM 1 G IJ SOLR
1.0000 g | Freq: Once | INTRAMUSCULAR | Status: AC
  Administered 2015-06-13: 1 g via INTRAMUSCULAR

## 2015-06-13 MED ORDER — LIDOCAINE HCL (PF) 1 % IJ SOLN
INTRAMUSCULAR | Status: AC
  Filled 2015-06-13: qty 5

## 2015-06-13 MED ORDER — ACETAMINOPHEN 325 MG PO TABS
ORAL_TABLET | ORAL | Status: AC
  Filled 2015-06-13: qty 2

## 2015-06-13 MED ORDER — CEFTRIAXONE SODIUM 1 G IJ SOLR
INTRAMUSCULAR | Status: AC
  Filled 2015-06-13: qty 10

## 2015-06-13 MED ORDER — LEVOFLOXACIN 500 MG PO TABS: 500.0000 mg | ORAL_TABLET | Freq: Every day | ORAL | Status: DC

## 2015-06-13 NOTE — ED Notes (Signed)
C/o cold sx onset yest associated w/fevers, chills, BA, BLE pain A&O x4... No acute distress.

## 2015-06-13 NOTE — Discharge Instructions (Signed)

## 2015-06-13 NOTE — ED Provider Notes (Signed)
CSN: [id number]     Arrival date & time 06/13/15  1602 History   First MD Initiated Contact with Patient 06/13/15 1634     Chief Complaint  Patient presents with  . URI   (Consider location/radiation/quality/duration/timing/severity/associated sxs/prior Treatment) HPI  History obtained from patient:   I have a urinary tract infection   2-3 days ago began to have frequency of urination 5-6 times per day, urine burns when "it hits the skin," occasional vaginal itching.  No relief from increased fluids, cranberry juice. Having fever and chills, no back pain Denies   Denies past history of urinary or vaginal symptoms or infections.  Past Medical History  Diagnosis Date  . Depression   . Hyperlipidemia   . Renal calculi     RIGHT  . Mild acid reflux   . Frequency of urination   . Urgency of urination   . Hematuria   . Arthritis   . Anemia    Past Surgical History  Procedure Laterality Date  . Inner ear surgery Right yrs ago  . Wrist surgery Right 2010  . Cystoscopy with stent placement Right 08/20/2012    Procedure: CYSTOSCOPY WITH STENT PLACEMENT;  Surgeon: Franchot Gallo, MD;  Location: [facility];  Service: Urology;  Laterality: Right;  . Knee arthroscopy Left 2012  . Necklift    . Cystoscopy with ureteroscopy, stone basketry and stent placement Right 09/21/2012    Procedure: CYSTOSCOPY WITH URETEROSCOPY, Retrograde Pyelogram;  Surgeon: Franchot Gallo, MD;  Location: [facility];  Service: Urology;  Laterality: Right;  . Cystoscopy with stent placement Left 08/01/2014    Procedure: CYSTOSCOPY WITH STENT PLACEMENT;  Surgeon: Franchot Gallo, MD;  Location: [place];  Service: Urology;  Laterality: Left;   Family History  Problem Relation Age of Onset  . ADD / ADHD Neg Hx   . Alcohol abuse Neg Hx   . Anxiety disorder Neg Hx   . Bipolar disorder Neg Hx   . Dementia Neg Hx   . Depression Neg Hx   . Drug abuse Neg Hx   . Schizophrenia Neg Hx   . Suicidality Neg  Hx    Social History  Substance Use Topics  . Smoking status: Former Smoker -- 0.50 packs/day for 10 years    Types: Cigarettes    Quit date: 09/15/1978  . Smokeless tobacco: Never Used  . Alcohol Use: No   OB History    No data available     Review of Systems Chills, not feeling well Allergies  Statins  Home Medications   Prior to Admission medications   Medication Sig Start Date End Date Taking? Authorizing Provider  ezetimibe (ZETIA) 10 MG tablet Take 1 tablet (10 mg total) by mouth daily. 06/05/13  Yes Milta Deiters T Mashburn, PA-C  sertraline (ZOLOFT) 100 MG tablet Take 1 tablet (100 mg total) by mouth daily. 01/29/14  Yes Charlcie Cradle, MD  busPIRone (BUSPAR) 10 MG tablet Take 2 tablets (20 mg total) by mouth 2 (two) times daily. 01/29/14   Charlcie Cradle, MD  ciprofloxacin (CIPRO) 500 MG tablet Take 1 tablet (500 mg total) by mouth 2 (two) times daily. 08/04/14   Festus Aloe, MD  guaiFENesin-codeine [facility]) 100-10 MG/5ML syrup Take 5 mLs by mouth every 4 (four) hours as needed for cough or congestion. Patient not taking: Reported on 07/31/2014 06/01/14   Janne Napoleon, NP  ipratropium (ATROVENT) 0.06 % nasal spray Place 2 sprays into both nostrils 4 (four) times daily. Patient not taking:  Reported on 07/31/2014 06/01/14   Janne Napoleon, NP  oxybutynin (DITROPAN) 5 MG tablet Take 1 tablet (5 mg total) by mouth every 8 (eight) hours as needed for bladder spasms. 08/02/14   Franchot Gallo, MD  oxyCODONE-acetaminophen (ROXICET) 5-325 MG per tablet Take 1 tablet by mouth every 6 (six) hours as needed for severe pain. 08/04/14   Festus Aloe, MD  promethazine (PHENERGAN) 12.5 MG tablet Take 1 tablet (12.5 mg total) by mouth every 6 (six) hours as needed for nausea or vomiting. 08/04/14   Festus Aloe, MD   Meds Ordered and Administered this Visit  Medications - No data to display  BP 100/65 mmHg  Pulse 96  Temp(Src) 101 F (38.3 C) (Oral)  Resp 16  SpO2 100% No data  found.   Physical Exam NURSES NOTES AND VITAL SIGNS REVIEWED. CONSTITUTIONAL: Well developed, well nourished, no acute distress HEENT: normocephalic, atraumatic, right and left TM's are normal EYES: Conjunctiva normal NECK:normal ROM, supple, no adenopathy PULMONARY:No respiratory distress, normal effort, Lungs: CTAb/l, no wheezes, or increased work of breathing CARDIOVASCULAR: RRR, no murmur ABDOMEN: soft, ND, NT, +'ve BS, No CVA_T MUSCULOSKELETAL: Normal ROM of all extremities,  SKIN: warm and dry without rash PSYCHIATRIC: Mood and affect, behavior are normal  ED Course  Procedures (including critical care time)  Labs Review Labs Reviewed  POCT URINALYSIS DIP (DEVICE) - Abnormal; Notable for the following:    Hgb urine dipstick SMALL (*)    Protein, ur >=300 (*)    Nitrite POSITIVE (*)    Leukocytes, UA SMALL (*)    All other components within normal limits    Imaging Review No results found.   Visual Acuity Review  Right Eye Distance:   Left Eye Distance:   Bilateral Distance:    Right Eye Near:   Left Eye Near:    Bilateral Near:      RX levaquin   MDM   1. UTI (lower urinary tract infection)   2. Fever, unspecified fever cause     Patient is reassured that there are no issues that require transfer to higher level of care at this time or additional tests. Patient is advised to continue home symptomatic treatment. Patient is advised that if there are new or worsening symptoms to attend the emergency department, contact primary care provider, or return to UC. Instructions of care provided discharged home in stable condition.    THIS NOTE WAS GENERATED USING A VOICE RECOGNITION SOFTWARE PROGRAM. ALL REASONABLE EFFORTS  WERE MADE TO PROOFREAD THIS DOCUMENT FOR ACCURACY.  I have verbally reviewed the discharge instructions with the patient. A printed AVS was given to the patient.  All questions were answered prior to discharge.      Konrad Felix,  [place] 06/13/15 [phone]

## 2015-06-16 DIAGNOSIS — D649 Anemia, unspecified: Secondary | ICD-10-CM | POA: Diagnosis not present

## 2015-06-16 LAB — URINE CULTURE
Culture: >=100000 — AB
SPECIAL REQUESTS: NORMAL

## 2015-06-24 DIAGNOSIS — K5730 Diverticulosis of large intestine without perforation or abscess without bleeding: Secondary | ICD-10-CM | POA: Diagnosis not present

## 2015-06-24 DIAGNOSIS — K449 Diaphragmatic hernia without obstruction or gangrene: Secondary | ICD-10-CM | POA: Diagnosis not present

## 2015-06-24 DIAGNOSIS — Z1211 Encounter for screening for malignant neoplasm of colon: Secondary | ICD-10-CM | POA: Diagnosis not present

## 2015-06-24 DIAGNOSIS — D509 Iron deficiency anemia, unspecified: Secondary | ICD-10-CM | POA: Diagnosis not present

## 2015-07-01 DIAGNOSIS — H2511 Age-related nuclear cataract, right eye: Secondary | ICD-10-CM | POA: Diagnosis not present

## 2015-07-01 DIAGNOSIS — H25011 Cortical age-related cataract, right eye: Secondary | ICD-10-CM | POA: Diagnosis not present

## 2015-07-01 DIAGNOSIS — H25811 Combined forms of age-related cataract, right eye: Secondary | ICD-10-CM | POA: Diagnosis not present

## 2015-08-08 DIAGNOSIS — D649 Anemia, unspecified: Secondary | ICD-10-CM | POA: Diagnosis not present

## 2015-10-14 DIAGNOSIS — D649 Anemia, unspecified: Secondary | ICD-10-CM | POA: Diagnosis not present

## 2016-01-06 DIAGNOSIS — D225 Melanocytic nevi of trunk: Secondary | ICD-10-CM | POA: Diagnosis not present

## 2016-01-06 DIAGNOSIS — L821 Other seborrheic keratosis: Secondary | ICD-10-CM | POA: Diagnosis not present

## 2016-01-06 DIAGNOSIS — L308 Other specified dermatitis: Secondary | ICD-10-CM | POA: Diagnosis not present

## 2016-08-17 DIAGNOSIS — G609 Hereditary and idiopathic neuropathy, unspecified: Secondary | ICD-10-CM | POA: Diagnosis not present

## 2016-08-17 DIAGNOSIS — E782 Mixed hyperlipidemia: Secondary | ICD-10-CM | POA: Diagnosis not present

## 2016-08-17 DIAGNOSIS — F39 Unspecified mood [affective] disorder: Secondary | ICD-10-CM | POA: Diagnosis not present

## 2016-08-17 DIAGNOSIS — M859 Disorder of bone density and structure, unspecified: Secondary | ICD-10-CM | POA: Diagnosis not present

## 2016-08-17 DIAGNOSIS — D649 Anemia, unspecified: Secondary | ICD-10-CM | POA: Diagnosis not present

## 2016-08-17 DIAGNOSIS — Z0000 Encounter for general adult medical examination without abnormal findings: Secondary | ICD-10-CM | POA: Diagnosis not present

## 2016-09-29 DIAGNOSIS — M8588 Other specified disorders of bone density and structure, other site: Secondary | ICD-10-CM | POA: Diagnosis not present

## 2016-09-29 DIAGNOSIS — M810 Age-related osteoporosis without current pathological fracture: Secondary | ICD-10-CM | POA: Diagnosis not present

## 2016-11-17 ENCOUNTER — Encounter (HOSPITAL_COMMUNITY): Payer: Self-pay | Admitting: Emergency Medicine

## 2016-11-17 ENCOUNTER — Ambulatory Visit (HOSPITAL_COMMUNITY)
Admission: EM | Admit: 2016-11-17 | Discharge: 2016-11-17 | Disposition: A | Discharge status: Home / Self Care | Payer: PPO | Attending: Family Medicine | Admitting: Family Medicine

## 2016-11-17 DIAGNOSIS — M545 Low back pain: Secondary | ICD-10-CM

## 2016-11-17 DIAGNOSIS — M549 Dorsalgia, unspecified: Secondary | ICD-10-CM | POA: Diagnosis not present

## 2016-11-17 LAB — POCT URINALYSIS DIP (DEVICE)
BILIRUBIN URINE: NEGATIVE
Glucose, UA: NEGATIVE mg/dL
KETONES UR: NEGATIVE mg/dL
Nitrite: POSITIVE — AB
PH: 5.5 (ref 5.0–8.0)
Protein, ur: NEGATIVE mg/dL
SPECIFIC GRAVITY, URINE: 1.015 (ref 1.005–1.030)
Urobilinogen, UA: 0.2 mg/dL (ref 0.0–1.0)

## 2016-11-17 MED ORDER — BACLOFEN 10 MG PO TABS
10.0000 mg | ORAL_TABLET | Freq: Three times a day (TID) | ORAL | 0 refills | Status: DC | PRN
Start: 2016-11-17 — End: 2017-06-26

## 2016-11-17 NOTE — ED Triage Notes (Signed)
PT reporst back pain for 2 weeks. PT reports it is usually a UTI or kidney stone

## 2016-11-17 NOTE — ED Notes (Signed)
Dr. Avon Gully made aware of urine results. She reports no treatment needed because PT is asymptomatic

## 2016-11-17 NOTE — ED Provider Notes (Signed)
[place]    CSN: [id number] Arrival date & time: 11/17/16  1332  History   Chief Complaint Chief Complaint  Patient presents with  . Back Pain    HPI Glenda Klein is a 71 y.o. female.   HPI  Patient presents with back pain.   Began about two weeks ago. Pain is always located in middle, but is sometimes lower back, sometimes more thoracic region. Her bra strap causes pain sometimes. Describes pain as constant and dull. Has taken Aleve which helped some. Other than that, cannot identify any alleviating factors. Worse when laying on back, but not worse with movement. Has had kidney stones and kidney infections in past and wonders if this is due to that, as she had back pain both times. Denies dysuria, hematuria, fever, chills, abdominal pain, nausea. Denies numbness, weakness, bowel or bladder incontinence, saddle anesthesia. Patient works at [employer] and is frequently lifting heavy things, hanging pictures, and doing other similar actions. Cannot remember any specific trauma occurring prior to onset of pain.    Past Medical History:  Diagnosis Date  . Anemia   . Arthritis   . Depression   . Frequency of urination   . Hematuria   . Hyperlipidemia   . Mild acid reflux   . Renal calculi    RIGHT  . Urgency of urination     Patient Active Problem List   Diagnosis Date Noted  . Left ureteral stone 08/01/2014  . MDD (major depressive disorder), recurrent episode, moderate ([place]) 06/26/2013  . MDD (major depressive disorder), recurrent episode ([place]) 06/02/2013    Past Surgical History:  Procedure Laterality Date  . CYSTOSCOPY WITH STENT PLACEMENT Right 08/20/2012   Procedure: CYSTOSCOPY WITH STENT PLACEMENT;  Surgeon: Franchot Gallo, MD;  Location: [facility];  Service: Urology;  Laterality: Right;  . CYSTOSCOPY WITH STENT PLACEMENT Left 08/01/2014   Procedure: CYSTOSCOPY WITH STENT PLACEMENT;  Surgeon: Franchot Gallo, MD;  Location: [place];  Service:  Urology;  Laterality: Left;  . CYSTOSCOPY WITH URETEROSCOPY, STONE BASKETRY AND STENT PLACEMENT Right 09/21/2012   Procedure: CYSTOSCOPY WITH URETEROSCOPY, Retrograde Pyelogram;  Surgeon: Franchot Gallo, MD;  Location: [facility];  Service: Urology;  Laterality: Right;  . INNER EAR SURGERY Right yrs ago  . KNEE ARTHROSCOPY Left 2012  . NECKLIFT    . WRIST SURGERY Right 2010    OB History    No data available       Home Medications    Prior to Admission medications   Medication Sig Start Date End Date Taking? Authorizing Provider  baclofen (LIORESAL) 10 MG tablet Take 1 tablet (10 mg total) by mouth 3 (three) times daily as needed (for back pain). 11/17/16 11/17/17  Verner Mould, MD  busPIRone (BUSPAR) 10 MG tablet Take 2 tablets (20 mg total) by mouth 2 (two) times daily. 01/29/14   Charlcie Cradle, MD  ciprofloxacin (CIPRO) 500 MG tablet Take 1 tablet (500 mg total) by mouth 2 (two) times daily. 08/04/14   Festus Aloe, MD  ezetimibe (ZETIA) 10 MG tablet Take 1 tablet (10 mg total) by mouth daily. 06/05/13   Ruben Im, PA-C  guaiFENesin-codeine (CHERATUSSIN AC) 100-10 MG/5ML syrup Take 5 mLs by mouth every 4 (four) hours as needed for cough or congestion. Patient not taking: Reported on 07/31/2014 06/01/14   Janne Napoleon, NP  ipratropium (ATROVENT) 0.06 % nasal spray Place 2 sprays into both nostrils 4 (four) times daily. Patient not taking: Reported on  07/31/2014 06/01/14   Janne Napoleon, NP  levofloxacin (LEVAQUIN) 500 MG tablet Take 1 tablet (500 mg total) by mouth daily. 06/13/15   Konrad Felix, PA  oxybutynin (DITROPAN) 5 MG tablet Take 1 tablet (5 mg total) by mouth every 8 (eight) hours as needed for bladder spasms. 08/02/14   Franchot Gallo, MD  oxyCODONE-acetaminophen (ROXICET) 5-325 MG per tablet Take 1 tablet by mouth every 6 (six) hours as needed for severe pain. 08/04/14   Festus Aloe, MD  promethazine (PHENERGAN) 12.5 MG tablet Take 1  tablet (12.5 mg total) by mouth every 6 (six) hours as needed for nausea or vomiting. 08/04/14   Festus Aloe, MD  sertraline (ZOLOFT) 100 MG tablet Take 1 tablet (100 mg total) by mouth daily. 01/29/14   Charlcie Cradle, MD    Family History Family History  Problem Relation Age of Onset  . ADD / ADHD Neg Hx   . Alcohol abuse Neg Hx   . Anxiety disorder Neg Hx   . Bipolar disorder Neg Hx   . Dementia Neg Hx   . Depression Neg Hx   . Drug abuse Neg Hx   . Schizophrenia Neg Hx   . Suicidality Neg Hx     Social History Social History  Substance Use Topics  . Smoking status: Former Smoker    Packs/day: 0.50    Years: 10.00    Types: Cigarettes    Quit date: 09/15/1978  . Smokeless tobacco: Never Used  . Alcohol use No     Allergies   Statins   Review of Systems Review of Systems  Constitutional: Negative for chills and fever.  Gastrointestinal: Negative for abdominal pain and nausea.  Genitourinary: Negative for dysuria, flank pain and hematuria.  Musculoskeletal: Positive for back pain.  Neurological: Negative for weakness and numbness.    Physical Exam Triage Vital Signs ED Triage Vitals  Enc Vitals Group     BP 11/17/16 1448 110/69     Pulse Rate 11/17/16 1448 98     Resp 11/17/16 1448 16     Temp 11/17/16 1448 98 F (36.7 C)     Temp Source 11/17/16 1448 Oral     SpO2 11/17/16 1448 99 %     Weight 11/17/16 1449 145 lb (65.8 kg)     Height --      Head Circumference --      Peak Flow --      Pain Score 11/17/16 1450 5     Pain Loc --      Pain Edu? --      Excl. in [place]? --    No data found.   Updated Vital Signs BP 110/69   Pulse 98   Temp 98 F (36.7 C) (Oral)   Resp 16   Wt 145 lb (65.8 kg)   SpO2 99%   BMI 24.89 kg/m   Physical Exam  Constitutional: She is oriented to person, place, and time. She appears well-developed and well-nourished. No distress.  HENT:  Head: Normocephalic and atraumatic.  Nose: Nose normal.  Mouth/Throat:  Oropharynx is clear and moist. No oropharyngeal exudate.  Eyes: Pupils are equal, round, and reactive to light. Conjunctivae and EOM are normal. Right eye exhibits no discharge. Left eye exhibits no discharge.  Cardiovascular: Normal rate, regular rhythm and normal heart sounds.   No murmur heard. Pulmonary/Chest: Effort normal and breath sounds normal. No respiratory distress. She has no wheezes.  Abdominal: Soft. Bowel sounds are normal. She exhibits no  distension. There is no tenderness.  No CVA tenderness  Musculoskeletal:  Paraspinal TTP in thoracic and lumbar region. No bony abnormalities, no step offs. Full ROM with no pain (twisting, flexion, extension, climbing onto and off of exam table). No gait abnormalities. 5/5 strength LE bilaterally.   Neurological: She is alert and oriented to person, place, and time.  Skin: Skin is warm and dry.  Psychiatric: She has a normal mood and affect. Her behavior is normal.     UC Treatments / Results  Labs (all labs ordered are listed, but only abnormal results are displayed) Labs Reviewed  POCT URINALYSIS DIP (DEVICE) - Abnormal; Notable for the following:       Result Value   Hgb urine dipstick LARGE (*)    Nitrite POSITIVE (*)    Leukocytes, UA SMALL (*)    All other components within normal limits    EKG  EKG Interpretation None       Radiology No results found.  Procedures Procedures (including critical care time)  Medications Ordered in UC Medications - No data to display   Initial Impression / Assessment and Plan / UC Course  I have reviewed the triage vital signs and the nursing notes.  Pertinent labs & imaging results that were available during my care of the patient were reviewed by me and considered in my medical decision making (see chart for details).     Patient presenting with midline back pain x2 wks. Most consistent with MSK etiology, as pain reproducible on palpation. Patient also frequently lifts heavy  objects at work, and likely sustained muscle strain while lifting something. No red flags concerning for cauda equina. Less likely nephrolithiasis or pyelo, as patient describing pain as dull rather than colicky, no reported urinary symptoms, no fevers/chills/abd pain/nausea, and no CVA tenderness. UA does have leuks and nitrates, however as patient is denying all urinary symptoms, will not treat for asymptomatic bacteriuria. NSAIDs for next few days. Also prescribed baclofen for breakthrough pain. Discussed using a heating pad or ice pack as well. Return precautions given.   Final Clinical Impressions(s) / UC Diagnoses   Final diagnoses:  Other acute back pain    New Prescriptions Discharge Medication List as of 11/17/2016  3:05 PM    START taking these medications   Details  baclofen (LIORESAL) 10 MG tablet Take 1 tablet (10 mg total) by mouth 3 (three) times daily as needed (for back pain)., Starting Wed 11/17/2016, Until Thu 11/17/2017, Normal        Adin Hector, MD, MPH PGY-3     Verner Mould, MD 11/17/16 [phone]

## 2016-11-17 NOTE — Discharge Instructions (Signed)
It was nice meeting you today Glenda Klein!  Please begin two ibuprofen or naproxen tablets (this includes Aleve, Advil, Motrin) every 6 hours for the next few days. After that, you can take this as needed for pain. If your back pain is not controlled with ibuprofen or naproxen, you can take one prescription baclofen tablet up to every 8 hours as needed. Placing a heating pad or ice pack on your back for about 10-15 minutes at a time may also help with your pain. Be sure to wrap an ice pack in a towel or cloth so that it is not sitting directly on your skin.   If you have any questions or concerns, please feel free to call the clinic.   Be well,  Dr. Avon Gully

## 2017-03-04 DIAGNOSIS — R5383 Other fatigue: Secondary | ICD-10-CM | POA: Diagnosis not present

## 2017-03-04 DIAGNOSIS — Z79899 Other long term (current) drug therapy: Secondary | ICD-10-CM | POA: Diagnosis not present

## 2017-03-04 DIAGNOSIS — R5381 Other malaise: Secondary | ICD-10-CM | POA: Diagnosis not present

## 2017-03-04 DIAGNOSIS — L659 Nonscarring hair loss, unspecified: Secondary | ICD-10-CM | POA: Diagnosis not present

## 2017-03-17 DIAGNOSIS — L814 Other melanin hyperpigmentation: Secondary | ICD-10-CM | POA: Diagnosis not present

## 2017-06-06 DIAGNOSIS — D649 Anemia, unspecified: Secondary | ICD-10-CM | POA: Diagnosis not present

## 2017-06-26 ENCOUNTER — Encounter (HOSPITAL_COMMUNITY)
Admission: EM | Disposition: A | Discharge status: Home / Self Care | Payer: Self-pay | Source: Home / Self Care | Attending: Internal Medicine

## 2017-06-26 ENCOUNTER — Emergency Department (HOSPITAL_COMMUNITY): Payer: PPO

## 2017-06-26 ENCOUNTER — Inpatient Hospital Stay (HOSPITAL_COMMUNITY)
Admission: EM | Admit: 2017-06-26 | Discharge: 2017-06-28 | DRG: 660 | Disposition: A | Discharge status: Home / Self Care | Payer: PPO | Attending: Internal Medicine | Admitting: Internal Medicine

## 2017-06-26 ENCOUNTER — Other Ambulatory Visit: Payer: Self-pay

## 2017-06-26 ENCOUNTER — Inpatient Hospital Stay (HOSPITAL_COMMUNITY): Payer: PPO | Admitting: Certified Registered Nurse Anesthetist

## 2017-06-26 ENCOUNTER — Encounter (HOSPITAL_COMMUNITY): Payer: Self-pay | Admitting: *Deleted

## 2017-06-26 ENCOUNTER — Inpatient Hospital Stay (HOSPITAL_COMMUNITY): Payer: PPO

## 2017-06-26 DIAGNOSIS — N281 Cyst of kidney, acquired: Secondary | ICD-10-CM | POA: Diagnosis not present

## 2017-06-26 DIAGNOSIS — Z87442 Personal history of urinary calculi: Secondary | ICD-10-CM | POA: Diagnosis not present

## 2017-06-26 DIAGNOSIS — F339 Major depressive disorder, recurrent, unspecified: Secondary | ICD-10-CM | POA: Diagnosis not present

## 2017-06-26 DIAGNOSIS — R079 Chest pain, unspecified: Secondary | ICD-10-CM | POA: Diagnosis not present

## 2017-06-26 DIAGNOSIS — E785 Hyperlipidemia, unspecified: Secondary | ICD-10-CM | POA: Diagnosis not present

## 2017-06-26 DIAGNOSIS — Z87891 Personal history of nicotine dependence: Secondary | ICD-10-CM | POA: Diagnosis not present

## 2017-06-26 DIAGNOSIS — R739 Hyperglycemia, unspecified: Secondary | ICD-10-CM | POA: Diagnosis not present

## 2017-06-26 DIAGNOSIS — N390 Urinary tract infection, site not specified: Secondary | ICD-10-CM | POA: Diagnosis not present

## 2017-06-26 DIAGNOSIS — Z888 Allergy status to other drugs, medicaments and biological substances status: Secondary | ICD-10-CM | POA: Diagnosis not present

## 2017-06-26 DIAGNOSIS — N132 Hydronephrosis with renal and ureteral calculous obstruction: Secondary | ICD-10-CM

## 2017-06-26 DIAGNOSIS — M810 Age-related osteoporosis without current pathological fracture: Secondary | ICD-10-CM | POA: Diagnosis not present

## 2017-06-26 DIAGNOSIS — Z6379 Other stressful life events affecting family and household: Secondary | ICD-10-CM | POA: Diagnosis not present

## 2017-06-26 DIAGNOSIS — N201 Calculus of ureter: Secondary | ICD-10-CM

## 2017-06-26 DIAGNOSIS — R0789 Other chest pain: Secondary | ICD-10-CM | POA: Diagnosis not present

## 2017-06-26 DIAGNOSIS — F419 Anxiety disorder, unspecified: Secondary | ICD-10-CM | POA: Diagnosis present

## 2017-06-26 DIAGNOSIS — F332 Major depressive disorder, recurrent severe without psychotic features: Secondary | ICD-10-CM | POA: Diagnosis not present

## 2017-06-26 DIAGNOSIS — D649 Anemia, unspecified: Secondary | ICD-10-CM | POA: Diagnosis present

## 2017-06-26 DIAGNOSIS — B961 Klebsiella pneumoniae [K. pneumoniae] as the cause of diseases classified elsewhere: Secondary | ICD-10-CM | POA: Diagnosis not present

## 2017-06-26 DIAGNOSIS — N10 Acute tubulo-interstitial nephritis: Secondary | ICD-10-CM

## 2017-06-26 DIAGNOSIS — K219 Gastro-esophageal reflux disease without esophagitis: Secondary | ICD-10-CM | POA: Diagnosis present

## 2017-06-26 DIAGNOSIS — N136 Pyonephrosis: Secondary | ICD-10-CM | POA: Diagnosis not present

## 2017-06-26 HISTORY — PX: CYSTOSCOPY W/ URETERAL STENT PLACEMENT: SHX1429

## 2017-06-26 LAB — BASIC METABOLIC PANEL
ANION GAP: 9 (ref 5–15)
BUN: 15 mg/dL (ref 6–20)
CO2: 23 mmol/L (ref 22–32)
Calcium: 9.3 mg/dL (ref 8.9–10.3)
Chloride: 107 mmol/L (ref 101–111)
Creatinine, Ser: 0.73 mg/dL (ref 0.44–1.00)
GFR calc Af Amer: >60 mL/min (ref >60)
GLUCOSE: 131 mg/dL — AB (ref 65–99)
POTASSIUM: 4.2 mmol/L (ref 3.5–5.1)
Sodium: 139 mmol/L (ref 135–145)

## 2017-06-26 LAB — URINALYSIS, ROUTINE W REFLEX MICROSCOPIC
Bilirubin Urine: NEGATIVE
Glucose, UA: NEGATIVE mg/dL
KETONES UR: NEGATIVE mg/dL
Nitrite: POSITIVE — AB
PH: 7 (ref 5.0–8.0)
Protein, ur: NEGATIVE mg/dL
Specific Gravity, Urine: 1.008 (ref 1.005–1.030)

## 2017-06-26 LAB — CBC
HEMATOCRIT: 35.5 % — AB (ref 36.0–46.0)
HEMOGLOBIN: 11.2 g/dL — AB (ref 12.0–15.0)
MCH: 27.5 pg (ref 26.0–34.0)
MCHC: 31.5 g/dL (ref 30.0–36.0)
MCV: 87.2 fL (ref 78.0–100.0)
Platelets: 333 10*3/uL (ref 150–400)
RBC: 4.07 MIL/uL (ref 3.87–5.11)
RDW: 15.9 % — ABNORMAL HIGH (ref 11.5–15.5)
WBC: 12.8 10*3/uL — AB (ref 4.0–10.5)

## 2017-06-26 LAB — HEPATIC FUNCTION PANEL
ALT: 12 U/L — ABNORMAL LOW (ref 14–54)
AST: 21 U/L (ref 15–41)
Albumin: 3.6 g/dL (ref 3.5–5.0)
Alkaline Phosphatase: 52 U/L (ref 38–126)
BILIRUBIN TOTAL: 0.6 mg/dL (ref 0.3–1.2)
Total Protein: 6.6 g/dL (ref 6.5–8.1)

## 2017-06-26 LAB — TROPONIN I: Troponin I: <0.03 ng/mL (ref <0.03)

## 2017-06-26 LAB — LIPASE, BLOOD: Lipase: 32 U/L (ref 11–51)

## 2017-06-26 LAB — CK: Total CK: 57 U/L (ref 38–234)

## 2017-06-26 SURGERY — CYSTOSCOPY, WITH RETROGRADE PYELOGRAM AND URETERAL STENT INSERTION
Anesthesia: General | Site: Ureter | Laterality: Left

## 2017-06-26 MED ORDER — LACTATED RINGERS IV SOLN: INTRAVENOUS | Status: DC

## 2017-06-26 MED ORDER — SODIUM CHLORIDE 0.9 % IV SOLN
1.0000 g | INTRAVENOUS | Status: DC
  Administered 2017-06-27: 1 g via INTRAVENOUS
  Filled 2017-06-26: qty 10
  Filled 2017-06-26: qty 1

## 2017-06-26 MED ORDER — OXYCODONE HCL 5 MG PO TABS: 5.0000 mg | ORAL_TABLET | ORAL | Status: DC | PRN

## 2017-06-26 MED ORDER — DEXAMETHASONE SODIUM PHOSPHATE 10 MG/ML IJ SOLN
INTRAMUSCULAR | Status: DC | PRN
  Administered 2017-06-26: 10 mg via INTRAVENOUS

## 2017-06-26 MED ORDER — IOPAMIDOL (ISOVUE-370) INJECTION 76%
INTRAVENOUS | Status: AC
  Filled 2017-06-26: qty 100

## 2017-06-26 MED ORDER — FENTANYL CITRATE (PF) 100 MCG/2ML IJ SOLN
INTRAMUSCULAR | Status: DC | PRN
  Administered 2017-06-26: 50 ug via INTRAVENOUS

## 2017-06-26 MED ORDER — SODIUM CHLORIDE 0.9 % IV SOLN
INTRAVENOUS | Status: DC
  Administered 2017-06-26 (×2): via INTRAVENOUS

## 2017-06-26 MED ORDER — FENTANYL CITRATE (PF) 100 MCG/2ML IJ SOLN
25.0000 ug | INTRAMUSCULAR | Status: DC | PRN
  Administered 2017-06-26 (×2): 50 ug via INTRAVENOUS
  Filled 2017-06-26 (×2): qty 2

## 2017-06-26 MED ORDER — ALENDRONATE SODIUM 70 MG PO TABS: 70.0000 mg | ORAL_TABLET | ORAL | Status: DC

## 2017-06-26 MED ORDER — ONDANSETRON HCL 4 MG/2ML IJ SOLN: 4.0000 mg | Freq: Four times a day (QID) | INTRAMUSCULAR | Status: DC | PRN

## 2017-06-26 MED ORDER — PROMETHAZINE HCL 25 MG/ML IJ SOLN: 6.2500 mg | INTRAMUSCULAR | Status: DC | PRN

## 2017-06-26 MED ORDER — FENTANYL CITRATE (PF) 100 MCG/2ML IJ SOLN
INTRAMUSCULAR | Status: AC
  Filled 2017-06-26: qty 2

## 2017-06-26 MED ORDER — PHENYLEPHRINE 40 MCG/ML (10ML) SYRINGE FOR IV PUSH (FOR BLOOD PRESSURE SUPPORT)
PREFILLED_SYRINGE | INTRAVENOUS | Status: AC
  Filled 2017-06-26: qty 10

## 2017-06-26 MED ORDER — ACETAMINOPHEN 325 MG PO TABS: 650.0000 mg | ORAL_TABLET | Freq: Four times a day (QID) | ORAL | Status: DC | PRN

## 2017-06-26 MED ORDER — SERTRALINE HCL 100 MG PO TABS
100.0000 mg | ORAL_TABLET | Freq: Every day | ORAL | Status: DC
  Administered 2017-06-27 – 2017-06-28 (×2): 100 mg via ORAL
  Filled 2017-06-26 (×3): qty 1

## 2017-06-26 MED ORDER — DEXAMETHASONE SODIUM PHOSPHATE 10 MG/ML IJ SOLN
INTRAMUSCULAR | Status: AC
  Filled 2017-06-26: qty 1

## 2017-06-26 MED ORDER — SODIUM CHLORIDE 0.9 % IV SOLN
1.0000 g | Freq: Once | INTRAVENOUS | Status: AC
  Administered 2017-06-26: 1 g via INTRAVENOUS
  Filled 2017-06-26: qty 10

## 2017-06-26 MED ORDER — IOPAMIDOL (ISOVUE-300) INJECTION 61%
INTRAVENOUS | Status: DC | PRN
  Administered 2017-06-26: 4 mL via URETHRAL

## 2017-06-26 MED ORDER — LACTATED RINGERS IV SOLN
INTRAVENOUS | Status: DC | PRN
  Administered 2017-06-26: 11:00:00 via INTRAVENOUS

## 2017-06-26 MED ORDER — EZETIMIBE 10 MG PO TABS
10.0000 mg | ORAL_TABLET | Freq: Every day | ORAL | Status: DC
  Administered 2017-06-27 – 2017-06-28 (×2): 10 mg via ORAL
  Filled 2017-06-26 (×2): qty 1

## 2017-06-26 MED ORDER — SCOPOLAMINE 1 MG/3DAYS TD PT72
MEDICATED_PATCH | TRANSDERMAL | Status: DC | PRN
  Administered 2017-06-26: 1 via TRANSDERMAL

## 2017-06-26 MED ORDER — SUCCINYLCHOLINE CHLORIDE 200 MG/10ML IV SOSY
PREFILLED_SYRINGE | INTRAVENOUS | Status: DC | PRN
  Administered 2017-06-26: 120 mg via INTRAVENOUS

## 2017-06-26 MED ORDER — LORAZEPAM 1 MG PO TABS
1.0000 mg | ORAL_TABLET | Freq: Four times a day (QID) | ORAL | Status: DC | PRN
  Administered 2017-06-28: 1 mg via ORAL
  Filled 2017-06-26: qty 1

## 2017-06-26 MED ORDER — ONDANSETRON HCL 4 MG/2ML IJ SOLN
INTRAMUSCULAR | Status: DC | PRN
  Administered 2017-06-26: 4 mg via INTRAVENOUS

## 2017-06-26 MED ORDER — IOPAMIDOL (ISOVUE-370) INJECTION 76%
100.0000 mL | Freq: Once | INTRAVENOUS | Status: AC | PRN
  Administered 2017-06-26: 100 mL via INTRAVENOUS

## 2017-06-26 MED ORDER — ACETAMINOPHEN 650 MG RE SUPP: 650.0000 mg | Freq: Four times a day (QID) | RECTAL | Status: DC | PRN

## 2017-06-26 MED ORDER — LIDOCAINE 2% (20 MG/ML) 5 ML SYRINGE
INTRAMUSCULAR | Status: AC
  Filled 2017-06-26: qty 5

## 2017-06-26 MED ORDER — LIDOCAINE 2% (20 MG/ML) 5 ML SYRINGE
INTRAMUSCULAR | Status: DC | PRN
  Administered 2017-06-26: 80 mg via INTRAVENOUS

## 2017-06-26 MED ORDER — PROPOFOL 10 MG/ML IV BOLUS
INTRAVENOUS | Status: DC | PRN
  Administered 2017-06-26: 170 mg via INTRAVENOUS

## 2017-06-26 MED ORDER — SODIUM CHLORIDE 0.9 % IV BOLUS: 1000.0000 mL | Freq: Once | INTRAVENOUS | Status: DC

## 2017-06-26 MED ORDER — SODIUM CHLORIDE 0.9 % IR SOLN
Status: DC | PRN
  Administered 2017-06-26: 1000 mL

## 2017-06-26 MED ORDER — ONDANSETRON HCL 4 MG PO TABS: 4.0000 mg | ORAL_TABLET | Freq: Four times a day (QID) | ORAL | Status: DC | PRN

## 2017-06-26 MED ORDER — ONDANSETRON HCL 4 MG/2ML IJ SOLN
INTRAMUSCULAR | Status: AC
  Filled 2017-06-26: qty 2

## 2017-06-26 SURGICAL SUPPLY — 14 items
BAG URO CATCHER STRL LF (MISCELLANEOUS) ×2 IMPLANT
CATH INTERMIT  6FR 70CM (CATHETERS) ×2 IMPLANT
CLOTH BEACON ORANGE TIMEOUT ST (SAFETY) ×2 IMPLANT
COVER FOOTSWITCH UNIV (MISCELLANEOUS) IMPLANT
COVER SURGICAL LIGHT HANDLE (MISCELLANEOUS) ×2 IMPLANT
GLOVE BIOGEL M 8.0 STRL (GLOVE) ×2 IMPLANT
GOWN STRL REUS W/ TWL XL LVL3 (GOWN DISPOSABLE) ×1 IMPLANT
GOWN STRL REUS W/TWL XL LVL3 (GOWN DISPOSABLE) ×2
GUIDEWIRE STR DUAL SENSOR (WIRE) ×2 IMPLANT
MANIFOLD NEPTUNE II (INSTRUMENTS) ×2 IMPLANT
PACK CYSTO (CUSTOM PROCEDURE TRAY) ×2 IMPLANT
STENT URET 6FRX24 CONTOUR (STENTS) ×1 IMPLANT
TRAY FOLEY W/METER SILVER 14FR (SET/KITS/TRAYS/PACK) ×1 IMPLANT
TUBING CONNECTING 10 (TUBING) ×2 IMPLANT

## 2017-06-26 NOTE — Transfer of Care (Signed)
Immediate Anesthesia Transfer of Care Note  Patient: Glenda Klein  Procedure(s) Performed: CYSTOSCOPY WITH LEFT RETROGRADE PYELOGRAM/LEFT URETERAL STENT PLACEMENT (Left Ureter)  Patient Location: PACU  Anesthesia Type:General  Level of Consciousness: drowsy and patient cooperative  Airway & Oxygen Therapy: Patient Spontanous Breathing and Patient connected to face mask oxygen  Post-op Assessment: Report given to RN and Post -op Vital signs reviewed and stable  Post vital signs: Reviewed and stable  Last Vitals:  Vitals Value Taken Time  BP    Temp    Pulse 93 06/26/2017 11:50 AM  Resp 26 06/26/2017 11:50 AM  SpO2 96 % 06/26/2017 11:50 AM  Vitals shown include unvalidated device data.  Last Pain:  Vitals:   06/26/17 1039  TempSrc:   PainSc: 4          Complications: No apparent anesthesia complications

## 2017-06-26 NOTE — Consult Note (Addendum)
Urology Consult  CC: Referring physician: Ezequiel Essex, MD Reason for referral: Obstructing left ureteral calculus with infection.  Impression/Assessment:  1.  Left ureteral stone with obstruction, infected appearing urine and imaging findings suggestive of possible segmental pyelonephritis without evidence of abscess.  Today we discussed the management of obstructing urinary stones. These options include observation with pain control, percutaneous nephrostomy tube, or cystoscopy with retrograde ureteral stent placement with possible ureteroscopy and laser lithotripsy.  We discussed the risks, benefits, alternatives and likelihood of achieving the patient's goals.  We discussed which options are relevant to this particular situation. We discussed the natural history of stones and the as well as the complications of untreated stones and the impact on quality of life without treatment as well as with each of the above listed treatments. We also discussed the efficacy of each treatment. With any of these management options I discussed the signs and symptoms of infection and the need for emergent treatment should these be experienced.   For observation I described the risks which include, but are not limited to, silent renal damage, life-threatening infection, need for emergent surgery, failure to pass stone, and pain.  In this particular situation I do not feel that this is an appropriate form of management.  For stent placement with or without ureteroscopy and lithotripsy I described the risks which include, but are not limited to, heart attack, stroke, pulmonary embolus, death, bleeding, infection, damage to contiguous structures, positioning injury, ureteral stricture, ureteral avulsion, ureteral injury, need for ureteral stent, inability to perform ureteroscopy, need for an interval procedure, inability to clear stone burden, stent discomfort and pain.  I told her I felt that this would be the  best way to manage her stone as there is no contraindication at this time to stent placement.  For percutaneous nephrostomy tube I described the risks which including, but are not limited to, heart attack, stroke, pulmonary embolus, death, arterial venous fistula or malformation, need for embolization of kidney, loss of kidney or renal function.  The patient had the opportunity to ask questions to their stated satisfaction and voiced understanding.  I do not feel that continuous access is necessary as I feel decompression and source control can be achieved with stent placement.  2.  Bilateral renal calculi: She has bilateral, small, peripherally located renal calculi that are not causing obstruction at this time.  3.  Right renal cyst: An incidental right renal cyst was noted on her CT scan.  It appears simple and is of no clinical significance currently.    Plan:  1.  Culture urine. 2.  Broad-spectrum intravenous antibiotics. 3.  Transfer to [facility] for urgent stent placement   History of Present Illness: Glenda Klein is a 72 year old female patient seen in hospital consultation for an obstructing left ureteral stone and infected urine. She presented to the emergency room with low back pain after doing lifting.  This had been present for several days she then began experiencing some pain in the right side of her chest and due to this she presented to the emergency room.  She reported some dysuria but no hematuria.  A CT scan was obtained which revealed a 5.5 mm left proximal ureteral stone with hydronephrosis associated with an area of decreased enhancement in the upper pole of the left kidney suggesting the possibility of nephritis with obstruction.  She was noted to have a decreased blood pressure. She has had a similar situation in the past in 6/16 at  which time she had an obstructing ureteral stone requiring urgent stent placement by Dr. Diona Fanti who she sees for her stone  disease.  Past Medical History:  Diagnosis Date  . Anemia   . Arthritis   . Depression   . Frequency of urination   . Hematuria   . Hyperlipidemia   . Mild acid reflux   . Renal calculi    RIGHT  . Urgency of urination    Past Surgical History:  Procedure Laterality Date  . CYSTOSCOPY WITH STENT PLACEMENT Right 08/20/2012   Procedure: CYSTOSCOPY WITH STENT PLACEMENT;  Surgeon: Franchot Gallo, MD;  Location: [facility];  Service: Urology;  Laterality: Right;  . CYSTOSCOPY WITH STENT PLACEMENT Left 08/01/2014   Procedure: CYSTOSCOPY WITH STENT PLACEMENT;  Surgeon: Franchot Gallo, MD;  Location: [place];  Service: Urology;  Laterality: Left;  . CYSTOSCOPY WITH URETEROSCOPY, STONE BASKETRY AND STENT PLACEMENT Right 09/21/2012   Procedure: CYSTOSCOPY WITH URETEROSCOPY, Retrograde Pyelogram;  Surgeon: Franchot Gallo, MD;  Location: [facility];  Service: Urology;  Laterality: Right;  . INNER EAR SURGERY Right yrs ago  . KNEE ARTHROSCOPY Left 2012  . NECKLIFT    . WRIST SURGERY Right 2010    Medications: Prior to Admission:  (Not in a hospital admission)  Allergies:  Allergies  Allergen Reactions  . Statins Other (See Comments)    Extremity weakness    Family History  Problem Relation Age of Onset  . ADD / ADHD Neg Hx   . Alcohol abuse Neg Hx   . Anxiety disorder Neg Hx   . Bipolar disorder Neg Hx   . Dementia Neg Hx   . Depression Neg Hx   . Drug abuse Neg Hx   . Schizophrenia Neg Hx   . Suicidality Neg Hx     Social History:  reports that she quit smoking about 38 years ago. Her smoking use included cigarettes. She has a 5.00 pack-year smoking history. She has never used smokeless tobacco. She reports that she does not drink alcohol or use drugs.  Review of Systems (10 point): Pertinent items are noted in HPI. A comprehensive review of systems was negative except as noted above.  Physical Exam:  Vital signs in last 24 hours: Temp:  [98.8 F (37.1  C)] 98.8 F (37.1 C) (05/05 0106) Pulse Rate:  [69-82] 69 (05/05 0700) Resp:  [17-27] 27 (05/05 0700) BP: (94-117)/(54-94) 94/54 (05/05 0700) SpO2:  [98 %-100 %] 98 % (05/05 0700) General appearance: alert and appears stated age Head: Normocephalic, without obvious abnormality, atraumatic Eyes: conjunctivae/corneas clear. EOM's intact.  Oropharynx: moist mucous membranes Neck: supple, symmetrical, trachea midline Resp: normal respiratory effort Cardio: regular rate and rhythm Back: symmetric, no curvature. ROM normal.  Left CVA tenderness. GI: soft, non-tender; bowel sounds normal; no masses,  no organomegaly Extremities: extremities normal, atraumatic, no cyanosis or edema Skin: Skin color normal. No visible rashes or lesions Neurologic: Grossly normal  Laboratory Data:  Recent Labs    06/26/17 0143  WBC 12.8*  HGB 11.2*  HCT 35.5*   BMET Recent Labs    06/26/17 0143  NA 139  K 4.2  CL 107  CO2 23  GLUCOSE 131*  BUN 15  CREATININE 0.73  CALCIUM 9.3   No results for input(s): LABPT, INR in the last 72 hours. No results for input(s): LABURIN in the last 72 hours. Results for orders placed or performed during the hospital encounter of 06/13/15  Urine culture  Status: Abnormal   Collection Time: 06/13/15  4:33 PM  Result Value Ref Range Status   Specimen Description URINE, CLEAN CATCH  Final   Special Requests Normal  Final   Culture >=100,000 COLONIES/mL ESCHERICHIA COLI (A)  Final   Report Status 06/16/2015 FINAL  Final   Organism ID, Bacteria ESCHERICHIA COLI (A)  Final      Susceptibility   Escherichia coli - MIC*    AMPICILLIN <=2 SENSITIVE Sensitive     CEFAZOLIN <=4 SENSITIVE Sensitive     CEFTRIAXONE <=1 SENSITIVE Sensitive     CIPROFLOXACIN <=0.25 SENSITIVE Sensitive     GENTAMICIN <=1 SENSITIVE Sensitive     IMIPENEM <=0.25 SENSITIVE Sensitive     NITROFURANTOIN <=16 SENSITIVE Sensitive     TRIMETH/SULFA <=20 SENSITIVE Sensitive      AMPICILLIN/SULBACTAM <=2 SENSITIVE Sensitive     PIP/TAZO <=4 SENSITIVE Sensitive     * >=100,000 COLONIES/mL ESCHERICHIA COLI   Creatinine: Recent Labs    06/26/17 0143  CREATININE 0.73    Imaging: Dg Chest 2 View  Result Date: 06/26/2017 CLINICAL DATA:  72 year old female with chest pain. EXAM: CHEST - 2 VIEW COMPARISON:  Chest radiograph dated 08/01/2014 FINDINGS: The lungs are clear. There is no pleural effusion or pneumothorax. The cardiac silhouette is within normal limits. Small hiatal hernia noted. No acute osseous pathology. IMPRESSION: 1. No acute cardiopulmonary process. 2. Small hiatal hernia. Electronically Signed   By: Anner Crete M.D.   On: 06/26/2017 02:18   Ct Angio Chest/abd/pel For Dissection W And/or Wo Contrast  Result Date: 06/26/2017 CLINICAL DATA:  72 year old female with chest pain. Concern for acute aortic syndrome. EXAM: CT ANGIOGRAPHY CHEST, ABDOMEN AND PELVIS TECHNIQUE: Multidetector CT imaging through the chest, abdomen and pelvis was performed using the standard protocol during bolus administration of intravenous contrast. Multiplanar reconstructed images and MIPs were obtained and reviewed to evaluate the vascular anatomy. CONTRAST:  149mL ISOVUE-370 IOPAMIDOL (ISOVUE-370) INJECTION 76% COMPARISON:  Chest radiograph dated 06/26/2017 FINDINGS: CTA CHEST FINDINGS Cardiovascular: There is no cardiomegaly or pericardial effusion. Mild atherosclerotic calcification of the thoracic aorta. No aneurysmal dilatation or evidence of dissection. The origins of the great vessels of the aortic arch are patent. There is no CT evidence of pulmonary embolism. Mediastinum/Nodes: No hilar or mediastinal adenopathy. There is a moderate size hiatal hernia. The esophagus is grossly unremarkable. No mediastinal fluid collection or hematoma. Lungs/Pleura: Minimal left lung base atelectatic changes secondary to mass effect by hiatal hernia. The lungs are clear. There is no pleural  effusion or pneumothorax. The central airways are patent. Musculoskeletal: There is degenerative changes of the spine. No acute osseous pathology. Review of the MIP images confirms the above findings. CTA ABDOMEN AND PELVIS FINDINGS VASCULAR Aorta: There is mild atherosclerotic calcification. No aneurysmal dilatation or dissection. Celiac: There is atherosclerotic calcification of the origin of the celiac axis. The celiac artery and its branches are patent. The common hepatic artery appears to arise from the aorta to the right of the origin of the celiac axis. There is an accessory hepatic artery arising from the SMA. SMA: Patent without evidence of aneurysm, dissection, vasculitis or significant stenosis. Renals: Atherosclerotic calcification of the origin of the left renal artery. The renal arteries are patent. IMA: Patent without evidence of aneurysm, dissection, vasculitis or significant stenosis. Inflow: Mild atherosclerotic calcification. No aneurysmal dilatation or dissection. Veins: No obvious venous abnormality within the limitations of this arterial phase study. Review of the MIP images confirms the above findings. NON-VASCULAR  No intra-abdominal free air or free fluid. Hepatobiliary: Multiple small hepatic hypodense lesions measure up to 10 mm in the inferior right lobe of the liver are not well characterized but may represent cysts. No intrahepatic biliary ductal dilatation. The gallbladder is unremarkable. Pancreas: There is minimal amount of stranding adjacent to the tail of the pancreas, likely extension from the perinephric stranding. Correlation with pancreatic enzymes recommended to exclude pancreatitis. No dilatation of the main pancreatic duct or gland atrophy. Spleen: Normal in size without focal abnormality. Adrenals/Urinary Tract: The adrenal glands are unremarkable. Multiple nonobstructing left renal calculi measure up to 9 mm in the inferior pole of the left kidney. There is a 5 mm stone in  the proximal left ureter with mild left hydronephrosis. Punctate nonobstructing right renal inferior pole stone is noted. There is no hydronephrosis on the right. There is a 4 cm right renal cyst. The right ureter and urinary bladder are unremarkable. Stomach/Bowel: Moderate hiatal hernia. There is no bowel obstruction or active inflammation. The appendix is normal. Lymphatic: No adenopathy. Reproductive: The uterus and ovaries are grossly unremarkable. No pelvic mass. Other: None Musculoskeletal: There is degenerative changes of the spine. No acute osseous pathology. Review of the MIP images confirms the above findings. IMPRESSION: 1. A 5 mm stone in the proximal left ureter with mild left hydronephrosis. There is an area of hypoenhancement of the upper pole of the left kidney. Correlation with urinalysis recommended to evaluate for pyelonephritis. No drainable fluid collection or abscess. Additional nonobstructing bilateral renal calculi noted. No hydronephrosis on the right. 2. No aortic aneurysm or dissection. No CT evidence of pulmonary embolism. 3. Mild stranding adjacent to the tail of the pancreas, likely extension from the perinephric stranding. Correlation with pancreatic enzymes recommended to exclude pancreatitis. 4.  Aortic Atherosclerosis (ICD10-I70.0). Electronically Signed   By: Anner Crete M.D.   On: 06/26/2017 06:52       Joana Nolton C 06/26/2017, 7:44 AM

## 2017-06-26 NOTE — Progress Notes (Signed)
PHARMACIST - PHYSICIAN COMMUNICATION  CONCERNING: P&T Medication Policy Regarding Oral Bisphosphonates  RECOMMENDATION: Your order for alendronate (Fosamax), ibandronate (Boniva), or risedronate (Actonel) has been discontinued at this time.  If the patient's post-hospital medical condition warrants safe use of this class of drugs, please resume the pre-hospital regimen upon discharge.  DESCRIPTION:  Alendronate (Fosamax), ibandronate (Boniva), and risedronate (Actonel) can cause severe esophageal erosions in patients who are unable to remain upright at least 30 minutes after taking this medication.   Since brief interruptions in therapy are thought to have minimal impact on bone mineral density, the [facility] has established that bisphosphonate orders should be routinely discontinued during hospitalization.   To override this safety policy and permit administration of Boniva, Fosamax, or Actonel in the hospital, prescribers must write "DO NOT HOLD" in the comments section when placing the order for this class of medications.  Netta Cedars, PharmD, BCPS 06/26/2017@1 :12 PM

## 2017-06-26 NOTE — ED Notes (Signed)
REport to PACU

## 2017-06-26 NOTE — H&P (Addendum)
History and Physical    Glenda Klein ENI:[id number] DOB: 12/05/1945 DOA: 06/26/2017  PCP: Kelton Pillar, MD Patient coming from: Home   Chief Complaint: Back pain and chest pain   HPI: Glenda Klein is a 72 y.o. female with medical history significant of Anemia,Athritis, Depression, Hematuria.Hyperlipidema, kidneys stones.  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  Pt reports last pm she had pain in her chest that last several seconds.  Pt reports cramping pains in her arms and legs.  Pt noticed some discomfort with urination.  Pt denies any blood in her urine.  Pt reports she has not had a fever.  Pt reports she is feeling better now. She is not currently having any pain.   Pt has had a large number of social stressors.  Pt reports her daughter has cancer. She is in the process or selling her house. Pt feels she has become sick because she is overwhelmed and has been working so hard.     ED Course: Pt was evaluated by Dr. Wyvonnia Dusky and diagnosed with a 83mm stone in the left ureter with mild hydronephrosis. UA shows greater than 50 wbcs and many bacteria.  Pt had an elvevated wbc count of 12.8. Dr. Wyvonnia Dusky spoke to Dr. Karsten Ro who request pt be admitted to [facility].  Pt has received Rocephin 1gram for treatment of UTI   Review of Systems: As per HPI otherwise all other systems reviewed and are negative  .ros Ambulatory Status: ambulatory without difficulty  Past Medical History:  Diagnosis Date  . Anemia   . Arthritis   . Depression   . Frequency of urination   . Hematuria   . Hyperlipidemia   . Mild acid reflux   . Renal calculi    RIGHT  . Urgency of urination     Past Surgical History:  Procedure Laterality Date  . CYSTOSCOPY WITH STENT PLACEMENT Right 08/20/2012   Procedure: CYSTOSCOPY WITH STENT PLACEMENT;  Surgeon: Franchot Gallo, MD;  Location: [facility];  Service: Urology;   Laterality: Right;  . CYSTOSCOPY WITH STENT PLACEMENT Left 08/01/2014   Procedure: CYSTOSCOPY WITH STENT PLACEMENT;  Surgeon: Franchot Gallo, MD;  Location: [place];  Service: Urology;  Laterality: Left;  . CYSTOSCOPY WITH URETEROSCOPY, STONE BASKETRY AND STENT PLACEMENT Right 09/21/2012   Procedure: CYSTOSCOPY WITH URETEROSCOPY, Retrograde Pyelogram;  Surgeon: Franchot Gallo, MD;  Location: [facility];  Service: Urology;  Laterality: Right;  . INNER EAR SURGERY Right yrs ago  . KNEE ARTHROSCOPY Left 2012  . NECKLIFT    . WRIST SURGERY Right 2010    Social History   Socioeconomic History  . Marital status: Legally Separated    Spouse name: Not on file  . Number of children: Not on file  . Years of education: Not on file  . Highest education level: Not on file  Occupational History  . Not on file  Social Needs  . Financial resource strain: Not on file  . Food insecurity:    Worry: Not on file    Inability: Not on file  . Transportation needs:    Medical: Not on file    Non-medical: Not on file  Tobacco Use  . Smoking status: Former Smoker    Packs/day: 0.50    Years: 10.00    Pack years: 5.00    Types: Cigarettes    Last attempt to quit: 09/15/1978  Years since quitting: 38.8  . Smokeless tobacco: Never Used  Substance and Sexual Activity  . Alcohol use: No  . Drug use: No  . Sexual activity: Never    Birth control/protection: Post-menopausal  Lifestyle  . Physical activity:    Days per week: Not on file    Minutes per session: Not on file  . Stress: Not on file  Relationships  . Social connections:    Talks on phone: Not on file    Gets together: Not on file    Attends religious service: Not on file    Active member of club or organization: Not on file    Attends meetings of clubs or organizations: Not on file    Relationship status: Not on file  . Intimate partner violence:    Fear of current or ex partner: Not on file    Emotionally  abused: Not on file    Physically abused: Not on file    Forced sexual activity: Not on file  Other Topics Concern  . Not on file  Social History Narrative  . Not on file    Allergies  Allergen Reactions  . Statins Other (See Comments)    Extremity weakness    Family History  Problem Relation Age of Onset  . ADD / ADHD Neg Hx   . Alcohol abuse Neg Hx   . Anxiety disorder Neg Hx   . Bipolar disorder Neg Hx   . Dementia Neg Hx   . Depression Neg Hx   . Drug abuse Neg Hx   . Schizophrenia Neg Hx   . Suicidality Neg Hx     Prior to Admission medications   Medication Sig Start Date End Date Taking? Authorizing Provider  alendronate (FOSAMAX) 70 MG tablet Take 70 mg by mouth once a week. On Monday 04/28/17  Yes [provider]  bimatoprost (LATISSE) 0.03 % ophthalmic solution Place 1 drop into both eyes at bedtime. 04/18/17  Yes [provider]  ezetimibe (ZETIA) 10 MG tablet Take 1 tablet (10 mg total) by mouth daily. 06/05/13  Yes Mashburn, Marlane Hatcher, PA-C  sertraline (ZOLOFT) 100 MG tablet Take 1 tablet (100 mg total) by mouth daily. 01/29/14  Yes Charlcie Cradle, MD  baclofen (LIORESAL) 10 MG tablet Take 1 tablet (10 mg total) by mouth 3 (three) times daily as needed (for back pain). Patient not taking: Reported on 06/26/2017 11/17/16 11/17/17  Verner Mould, MD  busPIRone (BUSPAR) 10 MG tablet Take 2 tablets (20 mg total) by mouth 2 (two) times daily. Patient not taking: Reported on 06/26/2017 01/29/14   Charlcie Cradle, MD  ciprofloxacin (CIPRO) 500 MG tablet Take 1 tablet (500 mg total) by mouth 2 (two) times daily. Patient not taking: Reported on 06/26/2017 08/04/14   Festus Aloe, MD  guaiFENesin-codeine [facility]) 100-10 MG/5ML syrup Take 5 mLs by mouth every 4 (four) hours as needed for cough or congestion. Patient not taking: Reported on 07/31/2014 06/01/14   Janne Napoleon, NP  ipratropium (ATROVENT) 0.06 % nasal spray Place 2 sprays into both nostrils  4 (four) times daily. Patient not taking: Reported on 07/31/2014 06/01/14   Janne Napoleon, NP  levofloxacin (LEVAQUIN) 500 MG tablet Take 1 tablet (500 mg total) by mouth daily. Patient not taking: Reported on 06/26/2017 06/13/15   Konrad Felix, PA  oxybutynin (DITROPAN) 5 MG tablet Take 1 tablet (5 mg total) by mouth every 8 (eight) hours as needed for bladder spasms. Patient not taking: Reported on  06/26/2017 08/02/14   Franchot Gallo, MD  oxyCODONE-acetaminophen (ROXICET) 5-325 MG per tablet Take 1 tablet by mouth every 6 (six) hours as needed for severe pain. Patient not taking: Reported on 06/26/2017 08/04/14   Festus Aloe, MD  promethazine (PHENERGAN) 12.5 MG tablet Take 1 tablet (12.5 mg total) by mouth every 6 (six) hours as needed for nausea or vomiting. Patient not taking: Reported on 06/26/2017 08/04/14   Festus Aloe, MD    Physical Exam: Vitals:   06/26/17 0600 06/26/17 0615 06/26/17 0630 06/26/17 0700  BP: 111/77 (!) 112/94  (!) 94/54  Pulse:  72 73 69  Resp:  (!) 22 17 (!) 27  Temp:      TempSrc:      SpO2:  100% 100% 98%     General:Pt anxious about admission, worried about her car.  and comfortable Eyes:  PERRL, EOMI, normal lids, iris ENT:  grossly normal hearing, lips & tongue, mmm Neck: no LAD, masses or thyromegaly Cardiovascular:  RRR, no m/r/g. No LE edema.  Respiratory: CTA bilaterally, no w/r/r. Normal respiratory effort. Abdomen: soft, ntnd, NABS Skin: no rash or induration seen on limited exam Musculoskeletal:  grossly normal tone BUE/BLE, good ROM, no bony abnormality Psychiatric:  grossly normal mood and affect, speech fluent and appropriate, AOx3 Neurologic:  CN 2-12 grossly intact, moves all extremities in coordinated fashion, sensation intact  Labs on Admission: I have personally reviewed following labs and imaging studies  CBC: Recent Labs  Lab 06/26/17 0143  WBC 12.8*  HGB 11.2*  HCT 35.5*  MCV 87.2  PLT 413   Basic Metabolic  Panel: Recent Labs  Lab 06/26/17 0143  NA 139  K 4.2  CL 107  CO2 23  GLUCOSE 131*  BUN 15  CREATININE 0.73  CALCIUM 9.3   GFR: CrCl cannot be calculated (Unknown ideal weight.). Liver Function Tests: Recent Labs  Lab 06/26/17 0515  AST 21  ALT 12*  ALKPHOS 52  BILITOT 0.6  PROT 6.6  ALBUMIN 3.6   Recent Labs  Lab 06/26/17 0515  LIPASE 32   No results for input(s): AMMONIA in the last 168 hours. Coagulation Profile: No results for input(s): INR, PROTIME in the last 168 hours. Cardiac Enzymes: Recent Labs  Lab 06/26/17 0143 06/26/17 0515  CKTOTAL  --  85  TROPONINI <0.03 <0.03   BNP (last 3 results) No results for input(s): PROBNP in the last 8760 hours. HbA1C: No results for input(s): HGBA1C in the last 72 hours. CBG: No results for input(s): GLUCAP in the last 168 hours. Lipid Profile: No results for input(s): CHOL, HDL, LDLCALC, TRIG, CHOLHDL, LDLDIRECT in the last 72 hours. Thyroid Function Tests: No results for input(s): TSH, T4TOTAL, FREET4, T3FREE, THYROIDAB in the last 72 hours. Anemia Panel: No results for input(s): VITAMINB12, FOLATE, FERRITIN, TIBC, IRON, RETICCTPCT in the last 72 hours. Urine analysis:    Component Value Date/Time   COLORURINE YELLOW 06/26/2017 0545   APPEARANCEUR CLOUDY (A) 06/26/2017 0545   LABSPEC 1.008 06/26/2017 0545   PHURINE 7.0 06/26/2017 0545   GLUCOSEU NEGATIVE 06/26/2017 0545   HGBUR MODERATE (A) 06/26/2017 0545   BILIRUBINUR NEGATIVE 06/26/2017 0545   KETONESUR NEGATIVE 06/26/2017 0545   PROTEINUR NEGATIVE 06/26/2017 0545   UROBILINOGEN 0.2 11/17/2016 1441   NITRITE POSITIVE (A) 06/26/2017 0545   LEUKOCYTESUR LARGE (A) 06/26/2017 0545    Creatinine Clearance: CrCl cannot be calculated (Unknown ideal weight.).  Sepsis Labs: @LABRCNTIP (procalcitonin:4,lacticidven:4) )No results found for this or any previous visit (from  the past 240 hour(s)).   Radiological Exams on Admission: Dg Chest 2  View  Result Date: 06/26/2017 CLINICAL DATA:  72 year old female with chest pain. EXAM: CHEST - 2 VIEW COMPARISON:  Chest radiograph dated 08/01/2014 FINDINGS: The lungs are clear. There is no pleural effusion or pneumothorax. The cardiac silhouette is within normal limits. Small hiatal hernia noted. No acute osseous pathology. IMPRESSION: 1. No acute cardiopulmonary process. 2. Small hiatal hernia. Electronically Signed   By: Anner Crete M.D.   On: 06/26/2017 02:18   Ct Angio Chest/abd/pel For Dissection W And/or Wo Contrast  Result Date: 06/26/2017 CLINICAL DATA:  72 year old female with chest pain. Concern for acute aortic syndrome. EXAM: CT ANGIOGRAPHY CHEST, ABDOMEN AND PELVIS TECHNIQUE: Multidetector CT imaging through the chest, abdomen and pelvis was performed using the standard protocol during bolus administration of intravenous contrast. Multiplanar reconstructed images and MIPs were obtained and reviewed to evaluate the vascular anatomy. CONTRAST:  148mL ISOVUE-370 IOPAMIDOL (ISOVUE-370) INJECTION 76% COMPARISON:  Chest radiograph dated 06/26/2017 FINDINGS: CTA CHEST FINDINGS Cardiovascular: There is no cardiomegaly or pericardial effusion. Mild atherosclerotic calcification of the thoracic aorta. No aneurysmal dilatation or evidence of dissection. The origins of the great vessels of the aortic arch are patent. There is no CT evidence of pulmonary embolism. Mediastinum/Nodes: No hilar or mediastinal adenopathy. There is a moderate size hiatal hernia. The esophagus is grossly unremarkable. No mediastinal fluid collection or hematoma. Lungs/Pleura: Minimal left lung base atelectatic changes secondary to mass effect by hiatal hernia. The lungs are clear. There is no pleural effusion or pneumothorax. The central airways are patent. Musculoskeletal: There is degenerative changes of the spine. No acute osseous pathology. Review of the MIP images confirms the above findings. CTA ABDOMEN AND PELVIS  FINDINGS VASCULAR Aorta: There is mild atherosclerotic calcification. No aneurysmal dilatation or dissection. Celiac: There is atherosclerotic calcification of the origin of the celiac axis. The celiac artery and its branches are patent. The common hepatic artery appears to arise from the aorta to the right of the origin of the celiac axis. There is an accessory hepatic artery arising from the SMA. SMA: Patent without evidence of aneurysm, dissection, vasculitis or significant stenosis. Renals: Atherosclerotic calcification of the origin of the left renal artery. The renal arteries are patent. IMA: Patent without evidence of aneurysm, dissection, vasculitis or significant stenosis. Inflow: Mild atherosclerotic calcification. No aneurysmal dilatation or dissection. Veins: No obvious venous abnormality within the limitations of this arterial phase study. Review of the MIP images confirms the above findings. NON-VASCULAR No intra-abdominal free air or free fluid. Hepatobiliary: Multiple small hepatic hypodense lesions measure up to 10 mm in the inferior right lobe of the liver are not well characterized but may represent cysts. No intrahepatic biliary ductal dilatation. The gallbladder is unremarkable. Pancreas: There is minimal amount of stranding adjacent to the tail of the pancreas, likely extension from the perinephric stranding. Correlation with pancreatic enzymes recommended to exclude pancreatitis. No dilatation of the main pancreatic duct or gland atrophy. Spleen: Normal in size without focal abnormality. Adrenals/Urinary Tract: The adrenal glands are unremarkable. Multiple nonobstructing left renal calculi measure up to 9 mm in the inferior pole of the left kidney. There is a 5 mm stone in the proximal left ureter with mild left hydronephrosis. Punctate nonobstructing right renal inferior pole stone is noted. There is no hydronephrosis on the right. There is a 4 cm right renal cyst. The right ureter and  urinary bladder are unremarkable. Stomach/Bowel: Moderate hiatal hernia. There is  no bowel obstruction or active inflammation. The appendix is normal. Lymphatic: No adenopathy. Reproductive: The uterus and ovaries are grossly unremarkable. No pelvic mass. Other: None Musculoskeletal: There is degenerative changes of the spine. No acute osseous pathology. Review of the MIP images confirms the above findings. IMPRESSION: 1. A 5 mm stone in the proximal left ureter with mild left hydronephrosis. There is an area of hypoenhancement of the upper pole of the left kidney. Correlation with urinalysis recommended to evaluate for pyelonephritis. No drainable fluid collection or abscess. Additional nonobstructing bilateral renal calculi noted. No hydronephrosis on the right. 2. No aortic aneurysm or dissection. No CT evidence of pulmonary embolism. 3. Mild stranding adjacent to the tail of the pancreas, likely extension from the perinephric stranding. Correlation with pancreatic enzymes recommended to exclude pancreatitis. 4.  Aortic Atherosclerosis (ICD10-I70.0). Electronically Signed   By: Anner Crete M.D.   On: 06/26/2017 06:52    EKG: Independently reviewed.   Assessment/Plan   1) UTI  Pt given Iv fluids and Rocephin.  2) 5 mm kidney stone left ureter with Hydronephrosis  Dr. Karsten Ro will se today to evaluate for stent     3) Chest pain,    Pt has had two negative troponins and Chest CT.  No evidence of cardiac or pulmonary etiology for pain at this time.    4) Anxiety  Pt anxious about hospitalization, procedure and multiple home stressor.  Ativan ordered po as needed for anxiety.    DVT prophylaxis: SCD Code Status: lonenox Family Communication: Friend  notified  No family available Disposition Plan: Admitted to [facility] called: Dr. Karsten Ro  Urology  Admission status: Inpatient    Alyse Low PA-C [facility]  If 7PM-7AM, please contact  night-coverage [email or web address] Password [email or web address]  06/26/2017, 8:12 AM

## 2017-06-26 NOTE — Anesthesia Postprocedure Evaluation (Signed)
Anesthesia Post Note  Patient: Glenda Klein  Procedure(s) Performed: CYSTOSCOPY WITH LEFT RETROGRADE PYELOGRAM/LEFT URETERAL STENT PLACEMENT (Left Ureter)     Patient location during evaluation: PACU Anesthesia Type: General Level of consciousness: sedated Pain management: pain level controlled Vital Signs Assessment: post-procedure vital signs reviewed and stable Respiratory status: spontaneous breathing and respiratory function stable Cardiovascular status: stable Postop Assessment: no apparent nausea or vomiting Anesthetic complications: no    Last Vitals:  Vitals:   06/26/17 1316 06/26/17 1318  BP:  103/60  Pulse:  69  Resp:  18  Temp: 36.7 C 36.7 C  SpO2:  94%    Last Pain:  Vitals:   06/26/17 1257  TempSrc:   PainSc: 0-No pain                 Stephano Arrants DANIEL

## 2017-06-26 NOTE — ED Triage Notes (Addendum)
8921 Dose of rocephine given by previous RN

## 2017-06-26 NOTE — Discharge Instructions (Signed)

## 2017-06-26 NOTE — ED Provider Notes (Signed)
[facility] EMERGENCY DEPARTMENT Provider Note   CSN: [id number] Arrival date & time: 06/26/17  0102     History   Chief Complaint Chief Complaint  Patient presents with  . Chest Pain    HPI Glenda Klein is a 72 y.o. female.  Patient with multiple complaints.  States she has had low back pain for the past several days after doing a lot of lifting at home.  She denies any focal weakness, numbness, tingling, bowel or bladder incontinence, fever or vomiting.  She was try to go to sleep last night but she had diffuse aches in her arms and legs that lasted for several hours and is since resolved.  She had an episode of right-sided chest pain at home that lasted several seconds at a time that comes and goes.  She does not have any chest pain at this time.  She reports no cardiac history.  The pain is in the right side of her chest and did not radiate.  It lasted for about several seconds at a time and this happened several times at home.  She has had some pain with urination but no blood in the urine.  The history is provided by the patient.    Past Medical History:  Diagnosis Date  . Anemia   . Arthritis   . Depression   . Frequency of urination   . Hematuria   . Hyperlipidemia   . Mild acid reflux   . Renal calculi    RIGHT  . Urgency of urination     Patient Active Problem List   Diagnosis Date Noted  . Left ureteral stone 08/01/2014  . MDD (major depressive disorder), recurrent episode, moderate ([place]) 06/26/2013  . MDD (major depressive disorder), recurrent episode ([place]) 06/02/2013    Past Surgical History:  Procedure Laterality Date  . CYSTOSCOPY WITH STENT PLACEMENT Right 08/20/2012   Procedure: CYSTOSCOPY WITH STENT PLACEMENT;  Surgeon: Franchot Gallo, MD;  Location: [facility];  Service: Urology;  Laterality: Right;  . CYSTOSCOPY WITH STENT PLACEMENT Left 08/01/2014   Procedure: CYSTOSCOPY WITH STENT PLACEMENT;  Surgeon: Franchot Gallo, MD;   Location: [facility];  Service: Urology;  Laterality: Left;  . CYSTOSCOPY WITH URETEROSCOPY, STONE BASKETRY AND STENT PLACEMENT Right 09/21/2012   Procedure: CYSTOSCOPY WITH URETEROSCOPY, Retrograde Pyelogram;  Surgeon: Franchot Gallo, MD;  Location: [facility];  Service: Urology;  Laterality: Right;  . INNER EAR SURGERY Right yrs ago  . KNEE ARTHROSCOPY Left 2012  . NECKLIFT    . WRIST SURGERY Right 2010     OB History   None      Home Medications    Prior to Admission medications   Medication Sig Start Date End Date Taking? Authorizing Provider  baclofen (LIORESAL) 10 MG tablet Take 1 tablet (10 mg total) by mouth 3 (three) times daily as needed (for back pain). 11/17/16 11/17/17  Verner Mould, MD  busPIRone (BUSPAR) 10 MG tablet Take 2 tablets (20 mg total) by mouth 2 (two) times daily. 01/29/14   Charlcie Cradle, MD  ciprofloxacin (CIPRO) 500 MG tablet Take 1 tablet (500 mg total) by mouth 2 (two) times daily. 08/04/14   Festus Aloe, MD  ezetimibe (ZETIA) 10 MG tablet Take 1 tablet (10 mg total) by mouth daily. 06/05/13   Ruben Im, PA-C  guaiFENesin-codeine (CHERATUSSIN AC) 100-10 MG/5ML syrup Take 5 mLs by mouth every 4 (four) hours as needed for cough or congestion. Patient not taking: Reported  on 07/31/2014 06/01/14   Janne Napoleon, NP  ipratropium (ATROVENT) 0.06 % nasal spray Place 2 sprays into both nostrils 4 (four) times daily. Patient not taking: Reported on 07/31/2014 06/01/14   Janne Napoleon, NP  levofloxacin (LEVAQUIN) 500 MG tablet Take 1 tablet (500 mg total) by mouth daily. 06/13/15   Konrad Felix, PA  oxybutynin (DITROPAN) 5 MG tablet Take 1 tablet (5 mg total) by mouth every 8 (eight) hours as needed for bladder spasms. 08/02/14   Franchot Gallo, MD  oxyCODONE-acetaminophen (ROXICET) 5-325 MG per tablet Take 1 tablet by mouth every 6 (six) hours as needed for severe pain. 08/04/14   Festus Aloe, MD  promethazine (PHENERGAN) 12.5 MG  tablet Take 1 tablet (12.5 mg total) by mouth every 6 (six) hours as needed for nausea or vomiting. 08/04/14   Festus Aloe, MD  sertraline (ZOLOFT) 100 MG tablet Take 1 tablet (100 mg total) by mouth daily. 01/29/14   Charlcie Cradle, MD    Family History Family History  Problem Relation Age of Onset  . ADD / ADHD Neg Hx   . Alcohol abuse Neg Hx   . Anxiety disorder Neg Hx   . Bipolar disorder Neg Hx   . Dementia Neg Hx   . Depression Neg Hx   . Drug abuse Neg Hx   . Schizophrenia Neg Hx   . Suicidality Neg Hx     Social History Social History   Tobacco Use  . Smoking status: Former Smoker    Packs/day: 0.50    Years: 10.00    Pack years: 5.00    Types: Cigarettes    Last attempt to quit: 09/15/1978    Years since quitting: 38.8  . Smokeless tobacco: Never Used  Substance Use Topics  . Alcohol use: No  . Drug use: No     Allergies   Statins   Review of Systems Review of Systems  Constitutional: Negative for activity change, appetite change and fever.  HENT: Negative for congestion and rhinorrhea.   Eyes: Negative for visual disturbance.  Respiratory: Positive for chest tightness. Negative for shortness of breath.   Cardiovascular: Negative for chest pain.  Gastrointestinal: Negative for abdominal pain, nausea and vomiting.  Genitourinary: Positive for dysuria. Negative for hematuria, vaginal bleeding and vaginal discharge.  Musculoskeletal: Positive for arthralgias and myalgias.  Skin: Negative for rash.  Neurological: Negative for dizziness, seizures, syncope and headaches.   all other systems are negative except as noted in the HPI and PMH.    Physical Exam Updated Vital Signs BP 117/72 (BP Location: Right Arm)   Pulse 82   Temp 98.8 F (37.1 C) (Oral)   Resp 18   SpO2 100%   Physical Exam  Constitutional: She is oriented to person, place, and time. She appears well-developed and well-nourished. No distress.  Anxious appearing  HENT:  Head:  Normocephalic and atraumatic.  Mouth/Throat: Oropharynx is clear and moist. No oropharyngeal exudate.  Eyes: Pupils are equal, round, and reactive to light. Conjunctivae and EOM are normal.  Neck: Normal range of motion. Neck supple.  No meningismus.  Cardiovascular: Normal rate, regular rhythm, normal heart sounds and intact distal pulses.  No murmur heard. Pulmonary/Chest: Effort normal and breath sounds normal. No respiratory distress.  Abdominal: Soft. There is no tenderness. There is no rebound and no guarding.  Musculoskeletal: Normal range of motion. She exhibits tenderness. She exhibits no edema.  Paraspinal lumbar tenderness, no midline tenderness  5/5 strength in bilateral lower extremities. Ankle  plantar and dorsiflexion intact. Great toe extension intact bilaterally. +2 DP and PT pulses. +2 patellar reflexes bilaterally. Normal gait.   Neurological: She is alert and oriented to person, place, and time. No cranial nerve deficit. She exhibits normal muscle tone. Coordination normal.   5/5 strength throughout. CN 2-12 intact.Equal grip strength.   Skin: Skin is warm.  Psychiatric: She has a normal mood and affect. Her behavior is normal.  Nursing note and vitals reviewed.    ED Treatments / Results  Labs (all labs ordered are listed, but only abnormal results are displayed) Labs Reviewed  BASIC METABOLIC PANEL - Abnormal; Notable for the following components:      Result Value   Glucose, Bld 131 (*)    All other components within normal limits  CBC - Abnormal; Notable for the following components:   WBC 12.8 (*)    Hemoglobin 11.2 (*)    HCT 35.5 (*)    RDW 15.9 (*)    All other components within normal limits  URINALYSIS, ROUTINE W REFLEX MICROSCOPIC - Abnormal; Notable for the following components:   APPearance CLOUDY (*)    Hgb urine dipstick MODERATE (*)    Nitrite POSITIVE (*)    Leukocytes, UA LARGE (*)    WBC, UA >50 (*)    Bacteria, UA MANY (*)    All other  components within normal limits  HEPATIC FUNCTION PANEL - Abnormal; Notable for the following components:   ALT 12 (*)    Bilirubin, Direct <0.1 (*)    All other components within normal limits  URINE CULTURE  TROPONIN I  LIPASE, BLOOD  TROPONIN I  CK    EKG EKG Interpretation  Date/Time:  Sunday Jun 26 2017 01:23:46 EDT Ventricular Rate:  86 PR Interval:  134 QRS Duration: 76 QT Interval:  348 QTC Calculation: 416 R Axis:     Text Interpretation:  Normal sinus rhythm Nonspecific ST and T wave abnormality Abnormal ECG No significant change was found minimal lateral ST depression Confirmed by Ezequiel Essex [phone]) on 06/26/2017 4:44:37 AM   Radiology Dg Chest 2 View  Result Date: 06/26/2017 CLINICAL DATA:  72 year old female with chest pain. EXAM: CHEST - 2 VIEW COMPARISON:  Chest radiograph dated 08/01/2014 FINDINGS: The lungs are clear. There is no pleural effusion or pneumothorax. The cardiac silhouette is within normal limits. Small hiatal hernia noted. No acute osseous pathology. IMPRESSION: 1. No acute cardiopulmonary process. 2. Small hiatal hernia. Electronically Signed   By: Anner Crete M.D.   On: 06/26/2017 02:18   Ct Angio Chest/abd/pel For Dissection W And/or Wo Contrast  Result Date: 06/26/2017 CLINICAL DATA:  71 year old female with chest pain. Concern for acute aortic syndrome. EXAM: CT ANGIOGRAPHY CHEST, ABDOMEN AND PELVIS TECHNIQUE: Multidetector CT imaging through the chest, abdomen and pelvis was performed using the standard protocol during bolus administration of intravenous contrast. Multiplanar reconstructed images and MIPs were obtained and reviewed to evaluate the vascular anatomy. CONTRAST:  152mL ISOVUE-370 IOPAMIDOL (ISOVUE-370) INJECTION 76% COMPARISON:  Chest radiograph dated 06/26/2017 FINDINGS: CTA CHEST FINDINGS Cardiovascular: There is no cardiomegaly or pericardial effusion. Mild atherosclerotic calcification of the thoracic aorta. No aneurysmal  dilatation or evidence of dissection. The origins of the great vessels of the aortic arch are patent. There is no CT evidence of pulmonary embolism. Mediastinum/Nodes: No hilar or mediastinal adenopathy. There is a moderate size hiatal hernia. The esophagus is grossly unremarkable. No mediastinal fluid collection or hematoma. Lungs/Pleura: Minimal left lung base atelectatic changes secondary  to mass effect by hiatal hernia. The lungs are clear. There is no pleural effusion or pneumothorax. The central airways are patent. Musculoskeletal: There is degenerative changes of the spine. No acute osseous pathology. Review of the MIP images confirms the above findings. CTA ABDOMEN AND PELVIS FINDINGS VASCULAR Aorta: There is mild atherosclerotic calcification. No aneurysmal dilatation or dissection. Celiac: There is atherosclerotic calcification of the origin of the celiac axis. The celiac artery and its branches are patent. The common hepatic artery appears to arise from the aorta to the right of the origin of the celiac axis. There is an accessory hepatic artery arising from the SMA. SMA: Patent without evidence of aneurysm, dissection, vasculitis or significant stenosis. Renals: Atherosclerotic calcification of the origin of the left renal artery. The renal arteries are patent. IMA: Patent without evidence of aneurysm, dissection, vasculitis or significant stenosis. Inflow: Mild atherosclerotic calcification. No aneurysmal dilatation or dissection. Veins: No obvious venous abnormality within the limitations of this arterial phase study. Review of the MIP images confirms the above findings. NON-VASCULAR No intra-abdominal free air or free fluid. Hepatobiliary: Multiple small hepatic hypodense lesions measure up to 10 mm in the inferior right lobe of the liver are not well characterized but may represent cysts. No intrahepatic biliary ductal dilatation. The gallbladder is unremarkable. Pancreas: There is minimal amount of  stranding adjacent to the tail of the pancreas, likely extension from the perinephric stranding. Correlation with pancreatic enzymes recommended to exclude pancreatitis. No dilatation of the main pancreatic duct or gland atrophy. Spleen: Normal in size without focal abnormality. Adrenals/Urinary Tract: The adrenal glands are unremarkable. Multiple nonobstructing left renal calculi measure up to 9 mm in the inferior pole of the left kidney. There is a 5 mm stone in the proximal left ureter with mild left hydronephrosis. Punctate nonobstructing right renal inferior pole stone is noted. There is no hydronephrosis on the right. There is a 4 cm right renal cyst. The right ureter and urinary bladder are unremarkable. Stomach/Bowel: Moderate hiatal hernia. There is no bowel obstruction or active inflammation. The appendix is normal. Lymphatic: No adenopathy. Reproductive: The uterus and ovaries are grossly unremarkable. No pelvic mass. Other: None Musculoskeletal: There is degenerative changes of the spine. No acute osseous pathology. Review of the MIP images confirms the above findings. IMPRESSION: 1. A 5 mm stone in the proximal left ureter with mild left hydronephrosis. There is an area of hypoenhancement of the upper pole of the left kidney. Correlation with urinalysis recommended to evaluate for pyelonephritis. No drainable fluid collection or abscess. Additional nonobstructing bilateral renal calculi noted. No hydronephrosis on the right. 2. No aortic aneurysm or dissection. No CT evidence of pulmonary embolism. 3. Mild stranding adjacent to the tail of the pancreas, likely extension from the perinephric stranding. Correlation with pancreatic enzymes recommended to exclude pancreatitis. 4.  Aortic Atherosclerosis (ICD10-I70.0). Electronically Signed   By: Anner Crete M.D.   On: 06/26/2017 06:52    Procedures Procedures (including critical care time)  Medications Ordered in ED Medications - No data to  display   Initial Impression / Assessment and Plan / ED Course  I have reviewed the triage vital signs and the nursing notes.  Pertinent labs & imaging results that were available during my care of the patient were reviewed by me and considered in my medical decision making (see chart for details).   atraumatic back pain with dysuria. R sided chest pain that lasted a few seconds and now resolved. EKG with ST depressions  laterally, somewhat worse than previous.  UA infected. Troponin negative x2. Low suspicion for ACS. Rocephin given after cultures obtained. CTA shows normal aorta, no PE but does show L proximal ureteral stone.  D/w Dr. Karsten Ro of urology. With leukocytosis and soft BP, recommend stent placement at [facility]. D/w PAC Sofia who will admit to hospitalist service and arrange transfer. Final Clinical Impressions(s) / ED Diagnoses   Final diagnoses:  Ureteral stone  Urinary tract infection without hematuria, site unspecified  Atypical chest pain    ED Discharge Orders    None       Kollyn Lingafelter, Annie Main, MD 06/26/17 0900

## 2017-06-26 NOTE — ED Triage Notes (Signed)
The pt is c/o back pain and aching all over since yesterday  She could not go to sleep so she took  2 aleve but she did not take much water with it.  Then she started having chest pain. She is very anxious  She  Drove herself here  She was afraid to stay alone at home

## 2017-06-26 NOTE — ED Triage Notes (Signed)
Pt Transport to [facility] via [facility].

## 2017-06-26 NOTE — Op Note (Signed)
PATIENT:  Glenda Klein  Preoperative diagnosis:  1. Left ureteral stone 2. UTI with possible early urosepsis  Postoperative diagnosis:  Same  Procedure:  1. Cystoscopy 2. left retrograde pyelography with interpretation  3. left ureteral stent placement  6 French, 24 cm (no string)   Surgeon: Thana Farr. Karsten Ro, M.D.  Anesthesia: General  Complications: None  EBL: Minimal  Specimens: None  Indication: Dilara Navarrete is a 72 year old female with an obstructing left ureteral stone and what appears to be a urinary tract infection with a decrease in her blood pressure suggestive of possible early sepsis.  After reviewing the management options for treatment, she have elected to proceed with the above surgical procedure(s). We have discussed the potential benefits and risks of the procedure, side effects of the proposed treatment, the likelihood of the patient achieving the goals of the procedure, and any potential problems that might occur during the procedure or recuperation. Informed consent has been obtained.  Description of procedure:  The patient was taken to the operating room and general anesthesia was induced.  The patient was placed in the dorsal lithotomy position, prepped and draped in the usual sterile fashion, and preoperative antibiotics were administered. A preoperative time-out was performed.   Cystourethroscopy was performed.  The patient's urethra was examined and was normal. The bladder was then systematically examined in its entirety. There was no evidence for any bladder tumors, stones, or other mucosal pathology However there wer multiple areas of small, raised, slightly reddish mucosa consistent with likely acute cystitis.  Attention then turned to the left ureteral orifice and a ureteral catheter was used to intubate the ureteral orifice.  Full-strength Omnipaque contrast was injected through the ureteral catheter and a retrograde pyelogram was performed.  A 0.038  sensor guidewire was then advanced up the left ureter into the renal pelvis under fluoroscopic guidance.  The wire was then backloaded through the cystoscope and a ureteral stent was advance over the wire using Seldinger technique.  The stent was positioned appropriately under fluoroscopic and cystoscopic guidance.  The wire was then removed with an adequate stent curl noted in the renal pelvis as well as in the bladder.  The bladder was then emptied, a 21 French Foley catheter was inserted in the bladder to affect maximum drainage and the procedure ended.  The patient appeared to tolerate the procedure well and without complications.  The patient was able to be awakened and transferred to the recovery unit in satisfactory condition.

## 2017-06-26 NOTE — Anesthesia Preprocedure Evaluation (Addendum)
Anesthesia Evaluation  Patient identified by MRN, date of birth, ID band Patient awake    Reviewed: Allergy & Precautions, NPO status , Patient's Chart, lab work & pertinent test results  History of Anesthesia Complications Negative for: history of anesthetic complications  Airway Mallampati: II  TM Distance: >3 FB Neck ROM: Full    Dental  (+) Teeth Intact, Dental Advisory Given, Caps   Pulmonary former smoker,    Pulmonary exam normal        Cardiovascular negative cardio ROS Normal cardiovascular exam     Neuro/Psych PSYCHIATRIC DISORDERS Depression negative neurological ROS     GI/Hepatic Neg liver ROS, GERD  ,  Endo/Other  negative endocrine ROS  Renal/GU Renal disease  negative genitourinary   Musculoskeletal  (+) Arthritis ,   Abdominal   Peds negative pediatric ROS (+)  Hematology negative hematology ROS (+)   Anesthesia Other Findings   Reproductive/Obstetrics negative OB ROS                            Anesthesia Physical  Anesthesia Plan  ASA: II and emergent  Anesthesia Plan: General   Post-op Pain Management:    Induction: Intravenous, Rapid sequence and Cricoid pressure planned  PONV Risk Score and Plan: 3 and Ondansetron, Dexamethasone and Diphenhydramine  Airway Management Planned: Oral ETT  Additional Equipment:   Intra-op Plan:   Post-operative Plan: Extubation in OR  Informed Consent: I have reviewed the patients History and Physical, chart, labs and discussed the procedure including the risks, benefits and alternatives for the proposed anesthesia with the patient or authorized representative who has indicated his/her understanding and acceptance.   Dental advisory given  Plan Discussed with: Anesthesiologist, CRNA and Surgeon  Anesthesia Plan Comments:        Anesthesia Quick Evaluation

## 2017-06-26 NOTE — Anesthesia Procedure Notes (Signed)
Procedure Name: Intubation Date/Time: 06/26/2017 11:23 AM Performed by: Montel Clock, CRNA Pre-anesthesia Checklist: Patient identified, Emergency Drugs available, Suction available and Patient being monitored Patient Re-evaluated:Patient Re-evaluated prior to induction Oxygen Delivery Method: Circle system utilized Preoxygenation: Pre-oxygenation with 100% oxygen Induction Type: IV induction, Rapid sequence and Cricoid Pressure applied Laryngoscope Size: Mac and 3 Grade View: Grade I Tube type: Oral Tube size: 7.0 mm Number of attempts: 1 Airway Equipment and Method: Stylet and Oral airway Placement Confirmation: ETT inserted through vocal cords under direct vision,  positive ETCO2 and breath sounds checked- equal and bilateral Secured at: 21 cm Tube secured with: Tape Dental Injury: Teeth and Oropharynx as per pre-operative assessment

## 2017-06-26 NOTE — ED Triage Notes (Signed)
Permit for surgery.

## 2017-06-26 NOTE — ED Triage Notes (Signed)
PT is not diabetic

## 2017-06-27 ENCOUNTER — Encounter (HOSPITAL_COMMUNITY): Payer: Self-pay | Admitting: Urology

## 2017-06-27 DIAGNOSIS — N201 Calculus of ureter: Secondary | ICD-10-CM

## 2017-06-27 DIAGNOSIS — N390 Urinary tract infection, site not specified: Secondary | ICD-10-CM

## 2017-06-27 DIAGNOSIS — N132 Hydronephrosis with renal and ureteral calculous obstruction: Secondary | ICD-10-CM

## 2017-06-27 LAB — BASIC METABOLIC PANEL
ANION GAP: 8 (ref 5–15)
BUN: 15 mg/dL (ref 6–20)
CO2: 21 mmol/L — ABNORMAL LOW (ref 22–32)
Calcium: 8.5 mg/dL — ABNORMAL LOW (ref 8.9–10.3)
Chloride: 114 mmol/L — ABNORMAL HIGH (ref 101–111)
Creatinine, Ser: 0.64 mg/dL (ref 0.44–1.00)
Glucose, Bld: 125 mg/dL — ABNORMAL HIGH (ref 65–99)
POTASSIUM: 4.1 mmol/L (ref 3.5–5.1)
SODIUM: 143 mmol/L (ref 135–145)

## 2017-06-27 LAB — CBC
HEMATOCRIT: 31.2 % — AB (ref 36.0–46.0)
Hemoglobin: 9.8 g/dL — ABNORMAL LOW (ref 12.0–15.0)
MCH: 28 pg (ref 26.0–34.0)
MCHC: 31.4 g/dL (ref 30.0–36.0)
MCV: 89.1 fL (ref 78.0–100.0)
Platelets: 310 10*3/uL (ref 150–400)
RBC: 3.5 MIL/uL — AB (ref 3.87–5.11)
RDW: 16.5 % — AB (ref 11.5–15.5)
WBC: 14.6 10*3/uL — AB (ref 4.0–10.5)

## 2017-06-27 MED ORDER — ZOLPIDEM TARTRATE 5 MG PO TABS
5.0000 mg | ORAL_TABLET | Freq: Every evening | ORAL | Status: DC | PRN
  Administered 2017-06-27: 5 mg via ORAL
  Filled 2017-06-27: qty 1

## 2017-06-27 NOTE — Progress Notes (Signed)
Patient ID: Glenda Klein, female   DOB: 06/26/1945, 72 y.o.   MRN: [id number]                                                                PROGRESS NOTE                                                                                                                                                                                                             Patient Demographics:    Glenda Klein, is a 72 y.o. female, DOB - September 08, 1945, WUJ:[id number]  Admit date - 06/26/2017   Admitting Physician Lady Deutscher, MD  Outpatient Primary MD for the patient is Kelton Pillar, MD  LOS - 1  Outpatient Specialists:     Chief Complaint  Patient presents with  . Chest Pain       Brief Narrative     72 y.o. female with medical history significant of Anemia,Athritis, Depression, Hematuria.Hyperlipidema, kidneys stones.  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 Pt reports last pm she had pain in her chest that last several seconds. Pt reports cramping pains in her arms and legs. Pt noticed some discomfort with urination. Pt denies any blood in her urine. Pt reports she has not had a fever. Pt reports she is feeling better now. She is not currently having any pain. Pt has had a large number of social stressors. Pt reports her daughter has cancer. She is in the process or selling her house. Pt feels she has become sick because she is overwhelmed and has been working so hard.     ED Course: Pt was evaluated by Dr. Wyvonnia Dusky and diagnosed with a 54mm stone in the left ureter with mild hydronephrosis. UA shows greater than 50 wbcs and many bacteria.  Pt had an elvevated wbc count of 12.8. Dr. Wyvonnia Dusky spoke to Dr. Karsten Ro who request pt be admitted to [facility].  Pt has received Rocephin 1gram for treatment of UTI       Subjective:    Glenda Klein today has been afebrile, urine culture still  pending.  Denies dysuria, hematuria, flank pain, n/v.  Wbc up slightly today.   No headache, No chest pain,  No abdominal pain -  No new weakness tingling or numbness, No Cough - SOB.    Assessment  & Plan :    Principal Problem:   Hydronephrosis with renal and ureteral calculous obstruction Active Problems:   MDD (major depressive disorder), recurrent episode ([place])   UTI (urinary tract infection)   Hyperglycemia   Leukocytosis/ Anemia Secondary to UTI and recent procedure Monitor  Check cbc in am  UTI Urine culture pending Cont Rocephin   Nephrolithiasis 68mm stone left ureter with hydronephrosis S/p cystoscopy, left retrograde pyelography, left ureteral stent 6 Fr 24cm (5/5) Oxycodone 5mg  po q4h prn pain Appreciate urology input  Hyperlipidemia Cont Zetia  Osteoporosis Fosamax on hold as per pharmacy policy  Chest pain resolved  Anxiety Cont zoloft  Ativan 1mg  po q6h prn  Code Status :  FULL CODE  Family Communication  : w patient  Disposition Plan  :  Home once urine culture back, hopefully tomorrow  Barriers For Discharge :   Consults  :  urology  Procedures  :  S/p ureteral stent 5/5  DVT Prophylaxis  :  Lovenox -- SCDs   Lab Results  Component Value Date   PLT 333 06/26/2017    Antibiotics  :  Rocephin 5/5=>   Anti-infectives (From admission, onward)   Start     Dose/Rate Route Frequency Ordered Stop   06/26/17 0900  cefTRIAXone (ROCEPHIN) 1 g in sodium chloride 0.9 % 100 mL IVPB     1 g 200 mL/hr over 30 Minutes Intravenous Every 24 hours 06/26/17 0852     06/26/17 0630  cefTRIAXone (ROCEPHIN) 1 g in sodium chloride 0.9 % 100 mL IVPB     1 g 200 mL/hr over 30 Minutes Intravenous  Once 06/26/17 0628 06/26/17 0727        Objective:   Vitals:   06/26/17 1316 06/26/17 1318 06/26/17 2140 06/27/17 0210  BP:  103/60 (!) 102/58 (!) 95/52  Pulse:  69 63 61  Resp:  18 20 18   Temp: 98.1 F (36.7 C) 98.1 F (36.7 C) 98.2 F (36.8 C) 98 F  (36.7 C)  TempSrc:   Oral Oral  SpO2:  94% 94% 92%  Weight:  72.1 kg (158 lb 15.2 oz)    Height:  5\' 4"  (1.626 m)      Wt Readings from Last 3 Encounters:  06/26/17 72.1 kg (158 lb 15.2 oz)  11/17/16 65.8 kg (145 lb)  08/01/14 65.8 kg (145 lb)     Intake/Output Summary (Last 24 hours) at 06/27/2017 0539 Last data filed at 06/27/2017 0212 Gross per 24 hour  Intake 2250.83 ml  Output 1350 ml  Net 900.83 ml     Physical Exam  Awake Alert, Oriented X 3, No new F.N deficits, Normal affect [place].AT,PERRAL Supple Neck,No JVD, No cervical lymphadenopathy appriciated.  Symmetrical Chest wall movement, Good air movement bilaterally, CTAB RRR,No Gallops,Rubs or new Murmurs, No Parasternal Heave +ve B.Sounds, Abd Soft, No tenderness, No organomegaly appriciated, No rebound - guarding or rigidity. No Cyanosis, Clubbing or edema, No new Rash or bruise      Data Review:    CBC Recent Labs  Lab 06/26/17 0143  WBC 12.8*  HGB 11.2*  HCT 35.5*  PLT 333  MCV 87.2  MCH 27.5  MCHC 31.5  RDW 15.9*    Chemistries  Recent Labs  Lab 06/26/17 0143 06/26/17 0515  NA 139  --   K 4.2  --   CL 107  --  CO2 23  --   GLUCOSE 131*  --   BUN 15  --   CREATININE 0.73  --   CALCIUM 9.3  --   AST  --  21  ALT  --  12*  ALKPHOS  --  52  BILITOT  --  0.6   ------------------------------------------------------------------------------------------------------------------ No results for input(s): CHOL, HDL, LDLCALC, TRIG, CHOLHDL, LDLDIRECT in the last 72 hours.  No results found for: HGBA1C ------------------------------------------------------------------------------------------------------------------ No results for input(s): TSH, T4TOTAL, T3FREE, THYROIDAB in the last 72 hours.  Invalid input(s): FREET3 ------------------------------------------------------------------------------------------------------------------ No results for input(s): VITAMINB12, FOLATE, FERRITIN, TIBC, IRON,  RETICCTPCT in the last 72 hours.  Coagulation profile No results for input(s): INR, PROTIME in the last 168 hours.  No results for input(s): DDIMER in the last 72 hours.  Cardiac Enzymes Recent Labs  Lab 06/26/17 0143 06/26/17 0515  TROPONINI <0.03 <0.03   ------------------------------------------------------------------------------------------------------------------ No results found for: BNP  Inpatient Medications  Scheduled Meds: . ezetimibe  10 mg Oral Daily  . sertraline  100 mg Oral Daily   Continuous Infusions: . sodium chloride 50 mL/hr at 06/26/17 1423  . cefTRIAXone (ROCEPHIN)  IV Stopped (06/26/17 0900)   PRN Meds:.acetaminophen **OR** acetaminophen, LORazepam, ondansetron **OR** ondansetron (ZOFRAN) IV, oxyCODONE  Micro Results No results found for this or any previous visit (from the past 240 hour(s)).  Radiology Reports Dg Chest 2 View  Result Date: 06/26/2017 CLINICAL DATA:  72 year old female with chest pain. EXAM: CHEST - 2 VIEW COMPARISON:  Chest radiograph dated 08/01/2014 FINDINGS: The lungs are clear. There is no pleural effusion or pneumothorax. The cardiac silhouette is within normal limits. Small hiatal hernia noted. No acute osseous pathology. IMPRESSION: 1. No acute cardiopulmonary process. 2. Small hiatal hernia. Electronically Signed   By: Anner Crete M.D.   On: 06/26/2017 02:18   Dg C-arm 1-60 Min-no Report  Result Date: 06/26/2017 Fluoroscopy was utilized by the requesting physician.  No radiographic interpretation.   Ct Angio Chest/abd/pel For Dissection W And/or Wo Contrast  Result Date: 06/26/2017 CLINICAL DATA:  72 year old female with chest pain. Concern for acute aortic syndrome. EXAM: CT ANGIOGRAPHY CHEST, ABDOMEN AND PELVIS TECHNIQUE: Multidetector CT imaging through the chest, abdomen and pelvis was performed using the standard protocol during bolus administration of intravenous contrast. Multiplanar reconstructed images and MIPs  were obtained and reviewed to evaluate the vascular anatomy. CONTRAST:  113mL ISOVUE-370 IOPAMIDOL (ISOVUE-370) INJECTION 76% COMPARISON:  Chest radiograph dated 06/26/2017 FINDINGS: CTA CHEST FINDINGS Cardiovascular: There is no cardiomegaly or pericardial effusion. Mild atherosclerotic calcification of the thoracic aorta. No aneurysmal dilatation or evidence of dissection. The origins of the great vessels of the aortic arch are patent. There is no CT evidence of pulmonary embolism. Mediastinum/Nodes: No hilar or mediastinal adenopathy. There is a moderate size hiatal hernia. The esophagus is grossly unremarkable. No mediastinal fluid collection or hematoma. Lungs/Pleura: Minimal left lung base atelectatic changes secondary to mass effect by hiatal hernia. The lungs are clear. There is no pleural effusion or pneumothorax. The central airways are patent. Musculoskeletal: There is degenerative changes of the spine. No acute osseous pathology. Review of the MIP images confirms the above findings. CTA ABDOMEN AND PELVIS FINDINGS VASCULAR Aorta: There is mild atherosclerotic calcification. No aneurysmal dilatation or dissection. Celiac: There is atherosclerotic calcification of the origin of the celiac axis. The celiac artery and its branches are patent. The common hepatic artery appears to arise from the aorta to the right of the origin of the celiac axis. There  is an accessory hepatic artery arising from the SMA. SMA: Patent without evidence of aneurysm, dissection, vasculitis or significant stenosis. Renals: Atherosclerotic calcification of the origin of the left renal artery. The renal arteries are patent. IMA: Patent without evidence of aneurysm, dissection, vasculitis or significant stenosis. Inflow: Mild atherosclerotic calcification. No aneurysmal dilatation or dissection. Veins: No obvious venous abnormality within the limitations of this arterial phase study. Review of the MIP images confirms the above  findings. NON-VASCULAR No intra-abdominal free air or free fluid. Hepatobiliary: Multiple small hepatic hypodense lesions measure up to 10 mm in the inferior right lobe of the liver are not well characterized but may represent cysts. No intrahepatic biliary ductal dilatation. The gallbladder is unremarkable. Pancreas: There is minimal amount of stranding adjacent to the tail of the pancreas, likely extension from the perinephric stranding. Correlation with pancreatic enzymes recommended to exclude pancreatitis. No dilatation of the main pancreatic duct or gland atrophy. Spleen: Normal in size without focal abnormality. Adrenals/Urinary Tract: The adrenal glands are unremarkable. Multiple nonobstructing left renal calculi measure up to 9 mm in the inferior pole of the left kidney. There is a 5 mm stone in the proximal left ureter with mild left hydronephrosis. Punctate nonobstructing right renal inferior pole stone is noted. There is no hydronephrosis on the right. There is a 4 cm right renal cyst. The right ureter and urinary bladder are unremarkable. Stomach/Bowel: Moderate hiatal hernia. There is no bowel obstruction or active inflammation. The appendix is normal. Lymphatic: No adenopathy. Reproductive: The uterus and ovaries are grossly unremarkable. No pelvic mass. Other: None Musculoskeletal: There is degenerative changes of the spine. No acute osseous pathology. Review of the MIP images confirms the above findings. IMPRESSION: 1. A 5 mm stone in the proximal left ureter with mild left hydronephrosis. There is an area of hypoenhancement of the upper pole of the left kidney. Correlation with urinalysis recommended to evaluate for pyelonephritis. No drainable fluid collection or abscess. Additional nonobstructing bilateral renal calculi noted. No hydronephrosis on the right. 2. No aortic aneurysm or dissection. No CT evidence of pulmonary embolism. 3. Mild stranding adjacent to the tail of the pancreas, likely  extension from the perinephric stranding. Correlation with pancreatic enzymes recommended to exclude pancreatitis. 4.  Aortic Atherosclerosis (ICD10-I70.0). Electronically Signed   By: Anner Crete M.D.   On: 06/26/2017 06:52    Time Spent in minutes  30   Jani Gravel M.D on 06/27/2017 at 5:39 AM  Between 7am to 7pm - Pager - [phone]    After 7pm go to [email or web address] - password [facility]  [facility] -  Office  [phone]

## 2017-06-27 NOTE — Progress Notes (Signed)
IV site leaking steadily.  Patient is requesting to d/c site and states she wishes to not be re-stuck. Dr. Verlon Au notified.  Orders received that we may leave out IV if patient will agree to take in 2L of fluid PO.  Patient informed of MD order.

## 2017-06-27 NOTE — Progress Notes (Signed)
Patient ID: Glenda Klein, female   DOB: 26-May-1945, 72 y.o.   MRN: [id number]    Assessment: Left ureteral stone: She has a stent in place draining her kidney with resolution of her flank pain.  She had no fever at the night.  Her white count is up just slightly from where it was yesterday but otherwise she looks good.  She is being bothered by her Foley so I will remove that.  Her urine cultures currently pending.   Plan:  1.  Continue antibiotics while awaiting final culture results. 2.  Will DC Foley catheter.   Subjective: Patient reports no flank pain or suprapubic discomfort.  She said the catheter was slightly uncomfortable.  Objective: Vital signs in last 24 hours: Temp:  [97.7 F (36.5 C)-98.6 F (37 C)] 98.1 F (36.7 C) (05/06 0800) Pulse Rate:  [50-93] 50 (05/06 0800) Resp:  [18-29] 18 (05/06 0800) BP: (90-111)/(52-67) 95/63 (05/06 0800) SpO2:  [90 %-100 %] 98 % (05/06 0800) Weight:  [72.1 kg (158 lb 15.2 oz)] 72.1 kg (158 lb 15.2 oz) (05/05 1318)A  Intake/Output from previous day: 05/05 0701 - 05/06 0700 In: 2650.8 [I.V.:2550.8; IV Piggyback:100] Out: 1550 [Urine:1550] Intake/Output this shift: No intake/output data recorded.  Past Medical History:  Diagnosis Date  . Anemia   . Arthritis   . Depression   . Frequency of urination   . Hematuria   . Hyperlipidemia   . Mild acid reflux   . Renal calculi    RIGHT  . Urgency of urination     Physical Exam:  General: Awake, alert and in no apparent distress Lungs: Normal respiratory effort, chest expands symmetrically.  Abdomen: Soft, non-tender & non-distended.  No CVAT. GU: Foley catheter indwelling draining clear urine.  Lab Results: Recent Labs    06/26/17 0143 06/27/17 0524  WBC 12.8* 14.6*  HGB 11.2* 9.8*  HCT 35.5* 31.2*   BMET Recent Labs    06/26/17 0143 06/27/17 0524  NA 139 143  K 4.2 4.1  CL 107 114*  CO2 23 21*  GLUCOSE 131* 125*  BUN 15 15  CREATININE 0.73 0.64  CALCIUM  9.3 8.5*   No results for input(s): LABURIN in the last 72 hours. Results for orders placed or performed during the hospital encounter of 06/13/15  Urine culture     Status: Abnormal   Collection Time: 06/13/15  4:33 PM  Result Value Ref Range Status   Specimen Description URINE, CLEAN CATCH  Final   Special Requests Normal  Final   Culture >=100,000 COLONIES/mL ESCHERICHIA COLI (A)  Final   Report Status 06/16/2015 FINAL  Final   Organism ID, Bacteria ESCHERICHIA COLI (A)  Final      Susceptibility   Escherichia coli - MIC*    AMPICILLIN <=2 SENSITIVE Sensitive     CEFAZOLIN <=4 SENSITIVE Sensitive     CEFTRIAXONE <=1 SENSITIVE Sensitive     CIPROFLOXACIN <=0.25 SENSITIVE Sensitive     GENTAMICIN <=1 SENSITIVE Sensitive     IMIPENEM <=0.25 SENSITIVE Sensitive     NITROFURANTOIN <=16 SENSITIVE Sensitive     TRIMETH/SULFA <=20 SENSITIVE Sensitive     AMPICILLIN/SULBACTAM <=2 SENSITIVE Sensitive     PIP/TAZO <=4 SENSITIVE Sensitive     * >=100,000 COLONIES/mL ESCHERICHIA COLI    Studies/Results: Dg C-arm 1-60 Min-no Report  Result Date: 06/26/2017 Fluoroscopy was utilized by the requesting physician.  No radiographic interpretation.      [place] C 06/27/2017, 8:55 AM

## 2017-06-28 LAB — COMPREHENSIVE METABOLIC PANEL
ALT: 13 U/L — AB (ref 14–54)
AST: 13 U/L — AB (ref 15–41)
Albumin: 3.1 g/dL — ABNORMAL LOW (ref 3.5–5.0)
Alkaline Phosphatase: 41 U/L (ref 38–126)
Anion gap: 7 (ref 5–15)
BUN: 12 mg/dL (ref 6–20)
CHLORIDE: 114 mmol/L — AB (ref 101–111)
CO2: 23 mmol/L (ref 22–32)
CREATININE: 0.62 mg/dL (ref 0.44–1.00)
Calcium: 8.5 mg/dL — ABNORMAL LOW (ref 8.9–10.3)
GFR calc Af Amer: >60 mL/min (ref >60)
Glucose, Bld: 98 mg/dL (ref 65–99)
Potassium: 3.7 mmol/L (ref 3.5–5.1)
Sodium: 144 mmol/L (ref 135–145)
Total Bilirubin: 0.5 mg/dL (ref 0.3–1.2)
Total Protein: 5.8 g/dL — ABNORMAL LOW (ref 6.5–8.1)

## 2017-06-28 LAB — CBC
HEMATOCRIT: 31 % — AB (ref 36.0–46.0)
HEMOGLOBIN: 10 g/dL — AB (ref 12.0–15.0)
MCH: 28.7 pg (ref 26.0–34.0)
MCHC: 32.3 g/dL (ref 30.0–36.0)
MCV: 88.8 fL (ref 78.0–100.0)
PLATELETS: 314 10*3/uL (ref 150–400)
RBC: 3.49 MIL/uL — AB (ref 3.87–5.11)
RDW: 16.6 % — ABNORMAL HIGH (ref 11.5–15.5)
WBC: 8.5 10*3/uL (ref 4.0–10.5)

## 2017-06-28 LAB — URINE CULTURE: Culture: 10000 — AB

## 2017-06-28 MED ORDER — CIPROFLOXACIN HCL 500 MG PO TABS: 500.0000 mg | ORAL_TABLET | Freq: Two times a day (BID) | ORAL | 0 refills | Status: AC

## 2017-06-28 MED ORDER — CIPROFLOXACIN HCL 500 MG PO TABS
500.0000 mg | ORAL_TABLET | Freq: Every day | ORAL | Status: AC
Start: 2017-06-28 — End: 2017-06-28
  Administered 2017-06-28: 500 mg via ORAL
  Filled 2017-06-28: qty 1

## 2017-06-28 NOTE — Discharge Summary (Signed)
Glenda Klein, is a 72 y.o. female  DOB Jul 12, 1945  MRN [id number].  Admission date:  06/26/2017  Admitting Physician  Lady Deutscher, MD  Discharge Date:  06/28/2017   Primary MD  Kelton Pillar, MD  Recommendations for primary care physician for things to follow:     Leukocytosis/ Anemia Secondary to UTI and recent procedure pcp to please check cbc, cmp in 1 week  UTI Urine culture 5/5 => 10,000CFU of klebsiella sensative to  Cipro 500mg  po bid x 5 days  Nephrolithiasis 78mm stone left ureter with hydronephrosis S/p cystoscopy, left retrograde pyelography, left ureteral stent 6 Fr 24cm (5/5) Appreciate urology input, f/u with urology in 1 week  Hyperlipidemia Cont Zetia  Osteoporosis Fosamax on hold as per pharmacy policy Resume Fosamax  Anxiety Cont zoloft  Ativan 1mg  po q6h prn  Code Status :  FULL CODE      Admission Diagnosis  Ureteral stone [N20.1] Atypical chest pain [R07.89] Urinary tract infection without hematuria, site unspecified [N39.0]   Discharge Diagnosis  Ureteral stone [N20.1] Atypical chest pain [R07.89] Urinary tract infection without hematuria, site unspecified [N39.0]    Principal Problem:   Hydronephrosis with renal and ureteral calculous obstruction Active Problems:   MDD (major depressive disorder), recurrent episode ([place])   Ureteral stone   UTI (urinary tract infection)   Hyperglycemia      Past Medical History:  Diagnosis Date  . Anemia   . Arthritis   . Depression   . Frequency of urination   . Hematuria   . Hyperlipidemia   . Mild acid reflux   . Renal calculi    RIGHT  . Urgency of urination     Past Surgical History:  Procedure Laterality Date  . CYSTOSCOPY W/ URETERAL STENT PLACEMENT Left 06/26/2017   Procedure: CYSTOSCOPY WITH LEFT RETROGRADE PYELOGRAM/LEFT URETERAL STENT PLACEMENT;  Surgeon: Kathie Rhodes, MD;   Location: [facility];  Service: Urology;  Laterality: Left;  . CYSTOSCOPY WITH STENT PLACEMENT Right 08/20/2012   Procedure: CYSTOSCOPY WITH STENT PLACEMENT;  Surgeon: Franchot Gallo, MD;  Location: [facility];  Service: Urology;  Laterality: Right;  . CYSTOSCOPY WITH STENT PLACEMENT Left 08/01/2014   Procedure: CYSTOSCOPY WITH STENT PLACEMENT;  Surgeon: Franchot Gallo, MD;  Location: [facility];  Service: Urology;  Laterality: Left;  . CYSTOSCOPY WITH URETEROSCOPY, STONE BASKETRY AND STENT PLACEMENT Right 09/21/2012   Procedure: CYSTOSCOPY WITH URETEROSCOPY, Retrograde Pyelogram;  Surgeon: Franchot Gallo, MD;  Location: [facility];  Service: Urology;  Laterality: Right;  . INNER EAR SURGERY Right yrs ago  . KNEE ARTHROSCOPY Left 2012  . NECKLIFT    . WRIST SURGERY Right 2010       HPI  from the history and physical done on the day of admission:     72 y.o.femalewith medical history significant ofAnemia,Athritis, Depression, Hematuria.Hyperlipidema, kidneys stones.  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 Pt reports  last pm she had pain in her chest that last several seconds. Pt reports cramping pains in her arms and legs. Pt noticed some discomfort with urination. Pt denies any blood in her urine. Pt reports she has not had a fever. Pt reports she is feeling better now. She is not currently having any pain. Pt has had a large number of social stressors. Pt reports her daughter has cancer. She is in the process or selling her house. Pt feels she has become sick because she is overwhelmed and has been working so hard.    ED Course:Pt was evaluated by Dr. Wyvonnia Dusky and diagnosed with a 76mm stone in the left ureter with mild hydronephrosis. UA shows greater than 50 wbcs and many bacteria. Pt had an elvevated wbc count of 12.8. Dr. Wyvonnia Dusky spoke to Dr. Karsten Ro who request pt be admitted to  [facility]. Pt has received Rocephin 1gram for treatment of UTI         Hospital Course:     Pt was admitted on 5/5 for UTI, and nephrolithiasis. Pt was transferred from [facility] ER to [facility].   Pt started on rocephin.  Pt was seen by Dr. Consuella Lose and  Had cystoscopy with left ureteral stent 5/5.  Pt urine culture grew out 10,000CFU of klebsiella.  Pt has been afebrile and feeling much better and requests to be discharge to home today.   Follow UP  Follow-up Information    Call Kathie Rhodes, MD.   Specialty:  Urology Why:  For an appointment in ~1 week when you get home. Contact information: [place] [address] [phone]            Consults obtained - urology  Discharge Condition: stable  Diet and Activity recommendation: See Discharge Instructions below  Discharge Instructions          Discharge Medications     Allergies as of 06/28/2017      Reactions   Statins Other (See Comments)   Extremity weakness      Medication List    TAKE these medications   alendronate 70 MG tablet Commonly known as:  FOSAMAX Take 70 mg by mouth once a week. On Monday   bimatoprost 0.03 % ophthalmic solution Commonly known as:  LATISSE Place 1 drop into both eyes at bedtime.   ciprofloxacin 500 MG tablet Commonly known as:  CIPRO Take 1 tablet (500 mg total) by mouth 2 (two) times daily for 5 days.   ezetimibe 10 MG tablet Commonly known as:  ZETIA Take 1 tablet (10 mg total) by mouth daily.   sertraline 100 MG tablet Commonly known as:  ZOLOFT Take 1 tablet (100 mg total) by mouth daily.       Major procedures and Radiology Reports - PLEASE review detailed and final reports for all details, in brief -       Dg Chest 2 View  Result Date: 06/26/2017 CLINICAL DATA:  72 year old female with chest pain. EXAM: CHEST - 2 VIEW COMPARISON:  Chest radiograph dated 08/01/2014 FINDINGS: The lungs are clear. There is no pleural effusion or  pneumothorax. The cardiac silhouette is within normal limits. Small hiatal hernia noted. No acute osseous pathology. IMPRESSION: 1. No acute cardiopulmonary process. 2. Small hiatal hernia. Electronically Signed   By: Anner Crete M.D.   On: 06/26/2017 02:18   Dg C-arm 1-60 Min-no Report  Result Date: 06/26/2017 Fluoroscopy was utilized by the requesting physician.  No radiographic interpretation.   Ct  Angio Chest/abd/pel For Dissection W And/or Wo Contrast  Result Date: 06/26/2017 CLINICAL DATA:  72 year old female with chest pain. Concern for acute aortic syndrome. EXAM: CT ANGIOGRAPHY CHEST, ABDOMEN AND PELVIS TECHNIQUE: Multidetector CT imaging through the chest, abdomen and pelvis was performed using the standard protocol during bolus administration of intravenous contrast. Multiplanar reconstructed images and MIPs were obtained and reviewed to evaluate the vascular anatomy. CONTRAST:  115mL ISOVUE-370 IOPAMIDOL (ISOVUE-370) INJECTION 76% COMPARISON:  Chest radiograph dated 06/26/2017 FINDINGS: CTA CHEST FINDINGS Cardiovascular: There is no cardiomegaly or pericardial effusion. Mild atherosclerotic calcification of the thoracic aorta. No aneurysmal dilatation or evidence of dissection. The origins of the great vessels of the aortic arch are patent. There is no CT evidence of pulmonary embolism. Mediastinum/Nodes: No hilar or mediastinal adenopathy. There is a moderate size hiatal hernia. The esophagus is grossly unremarkable. No mediastinal fluid collection or hematoma. Lungs/Pleura: Minimal left lung base atelectatic changes secondary to mass effect by hiatal hernia. The lungs are clear. There is no pleural effusion or pneumothorax. The central airways are patent. Musculoskeletal: There is degenerative changes of the spine. No acute osseous pathology. Review of the MIP images confirms the above findings. CTA ABDOMEN AND PELVIS FINDINGS VASCULAR Aorta: There is mild atherosclerotic calcification. No  aneurysmal dilatation or dissection. Celiac: There is atherosclerotic calcification of the origin of the celiac axis. The celiac artery and its branches are patent. The common hepatic artery appears to arise from the aorta to the right of the origin of the celiac axis. There is an accessory hepatic artery arising from the SMA. SMA: Patent without evidence of aneurysm, dissection, vasculitis or significant stenosis. Renals: Atherosclerotic calcification of the origin of the left renal artery. The renal arteries are patent. IMA: Patent without evidence of aneurysm, dissection, vasculitis or significant stenosis. Inflow: Mild atherosclerotic calcification. No aneurysmal dilatation or dissection. Veins: No obvious venous abnormality within the limitations of this arterial phase study. Review of the MIP images confirms the above findings. NON-VASCULAR No intra-abdominal free air or free fluid. Hepatobiliary: Multiple small hepatic hypodense lesions measure up to 10 mm in the inferior right lobe of the liver are not well characterized but may represent cysts. No intrahepatic biliary ductal dilatation. The gallbladder is unremarkable. Pancreas: There is minimal amount of stranding adjacent to the tail of the pancreas, likely extension from the perinephric stranding. Correlation with pancreatic enzymes recommended to exclude pancreatitis. No dilatation of the main pancreatic duct or gland atrophy. Spleen: Normal in size without focal abnormality. Adrenals/Urinary Tract: The adrenal glands are unremarkable. Multiple nonobstructing left renal calculi measure up to 9 mm in the inferior pole of the left kidney. There is a 5 mm stone in the proximal left ureter with mild left hydronephrosis. Punctate nonobstructing right renal inferior pole stone is noted. There is no hydronephrosis on the right. There is a 4 cm right renal cyst. The right ureter and urinary bladder are unremarkable. Stomach/Bowel: Moderate hiatal hernia. There  is no bowel obstruction or active inflammation. The appendix is normal. Lymphatic: No adenopathy. Reproductive: The uterus and ovaries are grossly unremarkable. No pelvic mass. Other: None Musculoskeletal: There is degenerative changes of the spine. No acute osseous pathology. Review of the MIP images confirms the above findings. IMPRESSION: 1. A 5 mm stone in the proximal left ureter with mild left hydronephrosis. There is an area of hypoenhancement of the upper pole of the left kidney. Correlation with urinalysis recommended to evaluate for pyelonephritis. No drainable fluid collection or abscess. Additional nonobstructing  bilateral renal calculi noted. No hydronephrosis on the right. 2. No aortic aneurysm or dissection. No CT evidence of pulmonary embolism. 3. Mild stranding adjacent to the tail of the pancreas, likely extension from the perinephric stranding. Correlation with pancreatic enzymes recommended to exclude pancreatitis. 4.  Aortic Atherosclerosis (ICD10-I70.0). Electronically Signed   By: Anner Crete M.D.   On: 06/26/2017 06:52    Micro Results     Recent Results (from the past 240 hour(s))  Urine Culture     Status: Abnormal (Preliminary result)   Collection Time: 06/26/17  8:01 AM  Result Value Ref Range Status   Specimen Description URINE, CLEAN CATCH  Final   Special Requests   Final    NONE Performed at [facility], [place] [address], [place], [place] [address]    Culture 10,000 COLONIES/mL KLEBSIELLA PNEUMONIAE (A)  Final   Report Status PENDING  Incomplete       Today   Subjective    Crescentia Boutwell today is feeling better. She is no longer having left flank pain.    No headache,no chest, no abdominal pain,no new weakness tingling or numbness, feels much better wants to go home today.    Objective   Blood pressure 122/80, pulse 65, temperature 98.8 F (37.1 C), temperature source Oral, resp. rate 20, height 5\' 4"  (1.626 m), weight 72.1 kg (158 lb 15.2  oz), SpO2 94 %.   Intake/Output Summary (Last 24 hours) at 06/28/2017 0541 Last data filed at 06/27/2017 2200 Gross per 24 hour  Intake 2462.92 ml  Output 200 ml  Net 2262.92 ml    Exam Awake Alert, Oriented x 3, No new F.N deficits, Normal affect [place].AT,PERRAL Supple Neck,No JVD, No cervical lymphadenopathy appriciated.  Symmetrical Chest wall movement, Good air movement bilaterally, CTAB RRR,No Gallops,Rubs or new Murmurs, No Parasternal Heave +ve B.Sounds, Abd Soft, Non tender, No organomegaly appriciated, No rebound -guarding or rigidity. No Cyanosis, Clubbing or edema, No new Rash or bruise No cva tenderness   Data Review   CBC w Diff:  Lab Results  Component Value Date   WBC 14.6 (H) 06/27/2017   HGB 9.8 (L) 06/27/2017   HCT 31.2 (L) 06/27/2017   PLT 310 06/27/2017   LYMPHOPCT 17 08/04/2014   MONOPCT 9 08/04/2014   EOSPCT 2 08/04/2014   BASOPCT 0 08/04/2014    CMP:  Lab Results  Component Value Date   NA 143 06/27/2017   K 4.1 06/27/2017   CL 114 (H) 06/27/2017   CO2 21 (L) 06/27/2017   BUN 15 06/27/2017   CREATININE 0.64 06/27/2017   PROT 6.6 06/26/2017   ALBUMIN 3.6 06/26/2017   BILITOT 0.6 06/26/2017   ALKPHOS 52 06/26/2017   AST 21 06/26/2017   ALT 12 (L) 06/26/2017  .   Total Time in preparing paper work, data evaluation and todays exam - 51 minutes  Jani Gravel M.D on 06/28/2017 at 5:41 AM  [facility]   Office  [phone]

## 2017-06-28 NOTE — Progress Notes (Signed)
Patient is stable for discharge. Discharge instructions and medications have been reviewed with the patient and all questions answered. AVS and prescriptions given to patient.  

## 2017-07-01 DIAGNOSIS — N200 Calculus of kidney: Secondary | ICD-10-CM | POA: Diagnosis not present

## 2017-07-01 DIAGNOSIS — N202 Calculus of kidney with calculus of ureter: Secondary | ICD-10-CM | POA: Diagnosis not present

## 2017-07-04 ENCOUNTER — Other Ambulatory Visit: Payer: Self-pay | Admitting: Urology

## 2017-07-05 ENCOUNTER — Other Ambulatory Visit: Payer: Self-pay

## 2017-07-05 ENCOUNTER — Encounter (HOSPITAL_BASED_OUTPATIENT_CLINIC_OR_DEPARTMENT_OTHER): Payer: Self-pay | Admitting: *Deleted

## 2017-07-05 NOTE — Progress Notes (Signed)
SPOKE W/ PT VIA PHONE FOR PRE-OP INTERVIEW.  NPO AFTER MN W/ EXCEPTION CLEAR LIQUIDS UNTIL 0800 (NO CREAM/ MILK PRODUCTS).  ARRIVE AT 1200.  CURRENT LAB RESULTS IN CHART AND Epic.  WILL TAKE ZOLOFT AND ZETIA AM DOS W/ SIPS OF WATER AND IF NEEDED TAKE OXYBUTYNIN.

## 2017-07-07 ENCOUNTER — Ambulatory Visit (HOSPITAL_BASED_OUTPATIENT_CLINIC_OR_DEPARTMENT_OTHER): Payer: PPO | Admitting: Anesthesiology

## 2017-07-07 ENCOUNTER — Encounter (HOSPITAL_BASED_OUTPATIENT_CLINIC_OR_DEPARTMENT_OTHER)
Admission: RE | Disposition: A | Discharge status: Home / Self Care | Payer: Self-pay | Source: Ambulatory Visit | Attending: Urology

## 2017-07-07 ENCOUNTER — Observation Stay (HOSPITAL_BASED_OUTPATIENT_CLINIC_OR_DEPARTMENT_OTHER)
Admission: RE | Admit: 2017-07-07 | Discharge: 2017-07-08 | Disposition: A | Discharge status: Home / Self Care | Payer: PPO | Source: Ambulatory Visit | Attending: Urology | Admitting: Urology

## 2017-07-07 ENCOUNTER — Encounter (HOSPITAL_BASED_OUTPATIENT_CLINIC_OR_DEPARTMENT_OTHER): Payer: Self-pay | Admitting: *Deleted

## 2017-07-07 ENCOUNTER — Other Ambulatory Visit: Payer: Self-pay

## 2017-07-07 DIAGNOSIS — F331 Major depressive disorder, recurrent, moderate: Secondary | ICD-10-CM | POA: Diagnosis not present

## 2017-07-07 DIAGNOSIS — N202 Calculus of kidney with calculus of ureter: Principal | ICD-10-CM | POA: Insufficient documentation

## 2017-07-07 DIAGNOSIS — Z466 Encounter for fitting and adjustment of urinary device: Secondary | ICD-10-CM | POA: Insufficient documentation

## 2017-07-07 DIAGNOSIS — N201 Calculus of ureter: Secondary | ICD-10-CM | POA: Diagnosis present

## 2017-07-07 DIAGNOSIS — Z888 Allergy status to other drugs, medicaments and biological substances status: Secondary | ICD-10-CM | POA: Diagnosis not present

## 2017-07-07 DIAGNOSIS — M1990 Unspecified osteoarthritis, unspecified site: Secondary | ICD-10-CM | POA: Insufficient documentation

## 2017-07-07 DIAGNOSIS — F329 Major depressive disorder, single episode, unspecified: Secondary | ICD-10-CM | POA: Insufficient documentation

## 2017-07-07 DIAGNOSIS — D649 Anemia, unspecified: Secondary | ICD-10-CM | POA: Diagnosis not present

## 2017-07-07 DIAGNOSIS — N132 Hydronephrosis with renal and ureteral calculous obstruction: Secondary | ICD-10-CM | POA: Diagnosis not present

## 2017-07-07 DIAGNOSIS — Z87891 Personal history of nicotine dependence: Secondary | ICD-10-CM | POA: Insufficient documentation

## 2017-07-07 DIAGNOSIS — E785 Hyperlipidemia, unspecified: Secondary | ICD-10-CM | POA: Diagnosis not present

## 2017-07-07 HISTORY — DX: Major depressive disorder, single episode, unspecified: F32.9

## 2017-07-07 HISTORY — DX: Nonscarring hair loss, unspecified: L65.9

## 2017-07-07 HISTORY — PX: HOLMIUM LASER APPLICATION: SHX5852

## 2017-07-07 HISTORY — PX: CYSTOSCOPY WITH RETROGRADE PYELOGRAM, URETEROSCOPY AND STENT PLACEMENT: SHX5789

## 2017-07-07 HISTORY — DX: Personal history of urinary calculi: Z87.442

## 2017-07-07 HISTORY — DX: Iron deficiency anemia, unspecified: D50.9

## 2017-07-07 HISTORY — DX: Calculus of ureter: N20.1

## 2017-07-07 HISTORY — DX: Diaphragmatic hernia without obstruction or gangrene: K44.9

## 2017-07-07 SURGERY — CYSTOURETEROSCOPY, WITH RETROGRADE PYELOGRAM AND STENT INSERTION
Anesthesia: General | Laterality: Left

## 2017-07-07 MED ORDER — LACTATED RINGERS IV SOLN
INTRAVENOUS | Status: DC
  Administered 2017-07-07: 13:00:00 via INTRAVENOUS
  Filled 2017-07-07: qty 1000

## 2017-07-07 MED ORDER — DEXAMETHASONE SODIUM PHOSPHATE 4 MG/ML IJ SOLN
INTRAMUSCULAR | Status: DC | PRN
  Administered 2017-07-07: 10 mg via INTRAVENOUS

## 2017-07-07 MED ORDER — PHENYLEPHRINE 40 MCG/ML (10ML) SYRINGE FOR IV PUSH (FOR BLOOD PRESSURE SUPPORT)
PREFILLED_SYRINGE | INTRAVENOUS | Status: DC | PRN
  Administered 2017-07-07 (×2): 80 ug via INTRAVENOUS

## 2017-07-07 MED ORDER — ZOLPIDEM TARTRATE 5 MG PO TABS
ORAL_TABLET | ORAL | Status: AC
  Filled 2017-07-07: qty 1

## 2017-07-07 MED ORDER — ZOLPIDEM TARTRATE 5 MG PO TABS
5.0000 mg | ORAL_TABLET | Freq: Every evening | ORAL | Status: DC | PRN
  Administered 2017-07-07: 5 mg via ORAL
  Filled 2017-07-07: qty 1

## 2017-07-07 MED ORDER — PROPOFOL 10 MG/ML IV BOLUS
INTRAVENOUS | Status: AC
  Filled 2017-07-07: qty 20

## 2017-07-07 MED ORDER — BIMATOPROST 0.03 % EX SOLN
1.0000 [drp] | Freq: Every day | CUTANEOUS | Status: DC
Start: 2017-07-07 — End: 2017-07-08

## 2017-07-07 MED ORDER — ONDANSETRON HCL 4 MG/2ML IJ SOLN
INTRAMUSCULAR | Status: DC | PRN
  Administered 2017-07-07: 4 mg via INTRAVENOUS

## 2017-07-07 MED ORDER — PHENYLEPHRINE 40 MCG/ML (10ML) SYRINGE FOR IV PUSH (FOR BLOOD PRESSURE SUPPORT)
PREFILLED_SYRINGE | INTRAVENOUS | Status: AC
  Filled 2017-07-07: qty 10

## 2017-07-07 MED ORDER — EZETIMIBE 10 MG PO TABS
10.0000 mg | ORAL_TABLET | Freq: Every morning | ORAL | Status: DC
  Filled 2017-07-07: qty 1

## 2017-07-07 MED ORDER — PHENAZOPYRIDINE HCL 100 MG PO TABS
ORAL_TABLET | ORAL | Status: AC
  Filled 2017-07-07: qty 2

## 2017-07-07 MED ORDER — ACETAMINOPHEN 325 MG PO TABS
650.0000 mg | ORAL_TABLET | ORAL | Status: DC | PRN
  Filled 2017-07-07: qty 2

## 2017-07-07 MED ORDER — ONDANSETRON HCL 4 MG/2ML IJ SOLN
INTRAMUSCULAR | Status: AC
  Filled 2017-07-07: qty 2

## 2017-07-07 MED ORDER — CEFAZOLIN SODIUM-DEXTROSE 2-4 GM/100ML-% IV SOLN
INTRAVENOUS | Status: AC
  Filled 2017-07-07: qty 100

## 2017-07-07 MED ORDER — PHENAZOPYRIDINE HCL 200 MG PO TABS
200.0000 mg | ORAL_TABLET | Freq: Three times a day (TID) | ORAL | Status: DC | PRN
  Administered 2017-07-07: 200 mg via ORAL
  Filled 2017-07-07: qty 1

## 2017-07-07 MED ORDER — FENTANYL CITRATE (PF) 100 MCG/2ML IJ SOLN
INTRAMUSCULAR | Status: AC
  Filled 2017-07-07: qty 2

## 2017-07-07 MED ORDER — OXYCODONE HCL 5 MG PO TABS
5.0000 mg | ORAL_TABLET | ORAL | Status: DC | PRN
  Filled 2017-07-07: qty 1

## 2017-07-07 MED ORDER — FENTANYL CITRATE (PF) 100 MCG/2ML IJ SOLN
25.0000 ug | INTRAMUSCULAR | Status: DC | PRN
  Filled 2017-07-07: qty 1

## 2017-07-07 MED ORDER — CEPHALEXIN 500 MG PO CAPS
500.0000 mg | ORAL_CAPSULE | Freq: Two times a day (BID) | ORAL | Status: DC
  Administered 2017-07-07: 500 mg via ORAL
  Filled 2017-07-07 (×2): qty 1

## 2017-07-07 MED ORDER — ONDANSETRON HCL 4 MG/2ML IJ SOLN
4.0000 mg | INTRAMUSCULAR | Status: DC | PRN
  Filled 2017-07-07: qty 2

## 2017-07-07 MED ORDER — FENTANYL CITRATE (PF) 100 MCG/2ML IJ SOLN
INTRAMUSCULAR | Status: DC | PRN
  Administered 2017-07-07: 25 ug via INTRAVENOUS
  Administered 2017-07-07: 50 ug via INTRAVENOUS
  Administered 2017-07-07: 25 ug via INTRAVENOUS

## 2017-07-07 MED ORDER — SERTRALINE HCL 100 MG PO TABS
100.0000 mg | ORAL_TABLET | Freq: Every morning | ORAL | Status: DC
  Filled 2017-07-07: qty 1

## 2017-07-07 MED ORDER — OXYBUTYNIN CHLORIDE 5 MG PO TABS
5.0000 mg | ORAL_TABLET | Freq: Three times a day (TID) | ORAL | Status: DC | PRN
  Filled 2017-07-07: qty 1

## 2017-07-07 MED ORDER — LIDOCAINE 2% (20 MG/ML) 5 ML SYRINGE
INTRAMUSCULAR | Status: DC | PRN
  Administered 2017-07-07: 40 mg via INTRAVENOUS

## 2017-07-07 MED ORDER — LIDOCAINE 2% (20 MG/ML) 5 ML SYRINGE
INTRAMUSCULAR | Status: AC
  Filled 2017-07-07: qty 5

## 2017-07-07 MED ORDER — PROPOFOL 10 MG/ML IV BOLUS
INTRAVENOUS | Status: DC | PRN
  Administered 2017-07-07: 150 mg via INTRAVENOUS
  Administered 2017-07-07: 20 mg via INTRAVENOUS

## 2017-07-07 MED ORDER — DEXAMETHASONE SODIUM PHOSPHATE 10 MG/ML IJ SOLN
INTRAMUSCULAR | Status: AC
  Filled 2017-07-07: qty 1

## 2017-07-07 MED ORDER — KETOROLAC TROMETHAMINE 30 MG/ML IJ SOLN
INTRAMUSCULAR | Status: DC | PRN
  Administered 2017-07-07: 15 mg via INTRAVENOUS

## 2017-07-07 MED ORDER — OXYCODONE HCL 5 MG PO TABS
ORAL_TABLET | ORAL | Status: AC
  Filled 2017-07-07: qty 1

## 2017-07-07 MED ORDER — CEFAZOLIN SODIUM-DEXTROSE 2-4 GM/100ML-% IV SOLN
2.0000 g | INTRAVENOUS | Status: AC
  Administered 2017-07-07: 2 g via INTRAVENOUS
  Filled 2017-07-07: qty 100

## 2017-07-07 SURGICAL SUPPLY — 31 items
BAG DRAIN URO-CYSTO SKYTR STRL (DRAIN) ×3 IMPLANT
BAG DRN UROCATH (DRAIN) ×1
BASKET ZERO TIP NITINOL 2.4FR (BASKET) IMPLANT
BSKT STON RTRVL ZERO TP 2.4FR (BASKET)
CATH INTERMIT  6FR 70CM (CATHETERS) ×3 IMPLANT
CATH URET 5FR 28IN CONE TIP (BALLOONS)
CATH URET 5FR 70CM CONE TIP (BALLOONS) IMPLANT
CLOTH BEACON ORANGE TIMEOUT ST (SAFETY) ×3 IMPLANT
ELECT REM PT RETURN 9FT ADLT (ELECTROSURGICAL)
ELECTRODE REM PT RTRN 9FT ADLT (ELECTROSURGICAL) IMPLANT
EXTRACTOR STONE NITINOL NGAGE (UROLOGICAL SUPPLIES) ×2 IMPLANT
FIBER LASER FLEXIVA 200 (UROLOGICAL SUPPLIES) IMPLANT
FIBER LASER FLEXIVA 365 (UROLOGICAL SUPPLIES) IMPLANT
FIBER LASER TRAC TIP (UROLOGICAL SUPPLIES) ×2 IMPLANT
GLOVE BIO SURGEON STRL SZ8 (GLOVE) ×3 IMPLANT
GOWN STRL REUS W/ TWL XL LVL3 (GOWN DISPOSABLE) ×1 IMPLANT
GOWN STRL REUS W/TWL XL LVL3 (GOWN DISPOSABLE) ×3
GUIDEWIRE ANG ZIPWIRE 038X150 (WIRE) IMPLANT
GUIDEWIRE STR DUAL SENSOR (WIRE) ×2 IMPLANT
INFUSOR MANOMETER BAG 3000ML (MISCELLANEOUS) ×1 IMPLANT
IV NS IRRIG 3000ML ARTHROMATIC (IV SOLUTION) ×6 IMPLANT
KIT TURNOVER CYSTO (KITS) ×3 IMPLANT
MANIFOLD NEPTUNE II (INSTRUMENTS) ×2 IMPLANT
NS IRRIG 500ML POUR BTL (IV SOLUTION) ×3 IMPLANT
PACK CYSTO (CUSTOM PROCEDURE TRAY) ×3 IMPLANT
SHEATH ACCESS URETERAL 38CM (SHEATH) IMPLANT
SHEATH URETERAL 12FRX28CM (UROLOGICAL SUPPLIES) ×2 IMPLANT
STENT URET 6FRX24 CONTOUR (STENTS) ×2 IMPLANT
TUBE CONNECTING 12'X1/4 (SUCTIONS) ×1
TUBE CONNECTING 12X1/4 (SUCTIONS) ×1 IMPLANT
TUBING UROLOGY SET (TUBING) ×2 IMPLANT

## 2017-07-07 NOTE — Transfer of Care (Signed)
  Last Vitals:  Vitals Value Taken Time  BP 131/79 07/07/2017  3:20 PM  Temp 36.5 C 07/07/2017  3:20 PM  Pulse 123 07/07/2017  3:23 PM  Resp 15 07/07/2017  3:23 PM  SpO2 80 % 07/07/2017  3:23 PM  Vitals shown include unvalidated device data.  Last Pain:  Vitals:   07/07/17 1157  TempSrc: Oral      Patients Stated Pain Goal: 4 (07/07/17 1230)  Immediate Anesthesia Transfer of Care Note  Patient: Glenda Klein  Procedure(s) Performed: Procedure(s) (LRB): CYSTOSCOPY WITH RETROGRADE PYELOGRAM, URETEROSCOPY AND STENT PLACEMENT, STENT EXCHANGE, ALSO STONE EXTRACTION\ (Left) HOLMIUM LASER APPLICATION (Left)  Patient Location: PACU  Anesthesia Type: General  Level of Consciousness: awake, alert  and oriented  Airway & Oxygen Therapy: Patient Spontanous Breathing and Patient connected to nasal cannula oxygen  Post-op Assessment: Report given to PACU RN and Post -op Vital signs reviewed and stable  Post vital signs: Reviewed and stable  Complications: No apparent anesthesia complications

## 2017-07-07 NOTE — Anesthesia Procedure Notes (Signed)
Procedure Name: LMA Insertion Date/Time: 07/07/2017 2:14 PM Performed by: Roberts Gaudy, MD Pre-anesthesia Checklist: Patient identified, Emergency Drugs available, Suction available and Patient being monitored Patient Re-evaluated:Patient Re-evaluated prior to induction Oxygen Delivery Method: Circle system utilized Preoxygenation: Pre-oxygenation with 100% oxygen Induction Type: IV induction Ventilation: Mask ventilation without difficulty LMA: LMA inserted LMA Size: 4.0 Number of attempts: 1 Airway Equipment and Method: Bite block Placement Confirmation: positive ETCO2 Tube secured with: Tape Dental Injury: Teeth and Oropharynx as per pre-operative assessment

## 2017-07-07 NOTE — Op Note (Signed)
Preoperative diagnosis: Left ureteral and renal calculi  Postoperative diagnosis: Same  Principal procedure: Cystoscopy, left double-J stent extraction, left ureteroscopy, extraction of left proximal ureteral calculus, holmium laser lithotripsy and extraction of left lower pole calculus, placement of 6 French by 24 cm contour double-J stent with tether  Surgeon: Mujahid Jalomo  Anesthesia: General with LMA  Complications: None  Specimen: Stone fragments  Estimated blood loss: None  Indications: 72 year old female with recent presentation (06/26/2017) of urinary tract infection and obstructing left proximal ureteral stone.  She was stented urgently, received IV antibiotics, and after short hospitalization was discharged home.  She is completed her antibiotics.  Recent culture in our office was negative patient presents at this time for definitive management of her left ureteral stone and, saying that she has a lower pole stone in the same side, we will manage that as well.  The procedure as well as risks and complications have been discussed with the patient.  She understands these and desires to proceed.  Findings: 4 to 5 mm left proximal ureteral stone.  There was one solitary left lower pole calyceal stone approximately 10 mm in size.  Besides these findings, the entire urothelium of the bladder and ureter as well as left kidney were normal.  Description of procedure: The patient was properly identified and marked in the holding area.  She received preoperative IV antibiotics.  She was taken to the operating room where general anesthetic was administered with the LMA.  She was placed in the dorsolithotomy position.  Genitalia and perineum were prepped and draped.  Proper timeout was performed.  A 22 French panendoscope was advanced into the bladder.  The stent was grasped and brought through the urethral meatus.  I then advanced a sensor tip guidewire through the stent and using fluoroscopic  guidance the sensor tip guidewire was advanced into the upper pole calyceal system.  The stent was removed over top of the guidewire which was then left in place.  I then passed a 4-1/2 [place] semirigid ureteroscope into the bladder, up the left ureter and the previously mentioned proximal ureteral stone was identified.  Saying that the ureter was passively dilated by the previous stent, I did not need to dilate the ureter or fragment the stone.  It was grasped with an engage basket and easily extracted through the ureter.  The ureteroscope was then removed.  I then passed the 12/14 ureteral access catheter over top of the guidewire, first using the obturator and then the entire system.  The obturator was then removed as well as the sensor tip guidewire.  The flexible ureteroscope was then passed up the ureter.  The entire pyelocalyceal system was inspected.  Multiple Randall's plaques were noted within the upper and interpolar calyceal system.  However, the only stone in the left system was in the lower pole.  It was fragmented into multiple smaller fragments using a 200 m fiber with power set at 0.8 J and frequency 30.  Following this, the engage basket was utilized to grasp and extract multiple fragments.  Tiny fragments could not be grasped adequately, however any fragment over 1 to 2 mm was extracted.  Following this, all calyces were inspected with the scope and no further significant fragments were noted.  I then removed the ureteroscope.  The sensor tip guidewire was replaced.  The ureteral access catheter was then removed.  I then backloaded the guidewire and passed the cystoscope into the bladder.  The 24 cm x 6 [place] contour  double-J stent was then placed using fluoroscopic and cystoscopic guidance.  Thread was left on the end of the stent.  The scope was then removed after the bladder was drained.  The tether was then trimmed, knotted, and placed within the patient's vagina.  At this point the  procedure was terminated.  The patient was awakened and taken to the PACU in stable condition.  She tolerated procedure well.  Because the patient does not have any one person to spend the night with her following this anesthetic, she was left in the surgical center for overnight recovery.

## 2017-07-07 NOTE — H&P (Signed)
H&P  Chief Complaint: Left-sided kidney stone  History of Present Illness: Glenda Klein is a 72 y.o. year old female who presents at this time for ureteroscopic management of a left proximal ureteral stone and left lower pole renal stone.  She had urgent stent placement by Dr. Karsten Ro on 06/26/2017 for an infected, obstructing left ureteral stone.  She has been managed with IV antibiotics since that time.  Recent check in the office revealed her urine to be uninfected.  She presents at this time for cystoscopy, left ureteral stent extraction, left ureteroscopy, holmium laser lithotripsy and extraction of left ureteral/renal calculi.  I have discussed the procedure with the patient, including attendant risks and complications.  She understands these and desires to proceed.  Past Medical History:  Diagnosis Date  . Alopecia   . Arthritis   . Frequency of urination   . Hematuria   . Hiatal hernia   . History of kidney stones   . Hyperlipidemia   . Iron deficiency anemia   . MDD (major depressive disorder)   . Renal calculi    LEFT  . Ureteral calculus, left   . Urgency of urination     Past Surgical History:  Procedure Laterality Date  . CATARACT EXTRACTION W/ INTRAOCULAR LENS  IMPLANT, BILATERAL  2017 approx.  . CYSTOSCOPY W/ URETERAL STENT PLACEMENT Left 06/26/2017   Procedure: CYSTOSCOPY WITH LEFT RETROGRADE PYELOGRAM/LEFT URETERAL STENT PLACEMENT;  Surgeon: Kathie Rhodes, MD;  Location: [facility];  Service: Urology;  Laterality: Left;  . CYSTOSCOPY WITH STENT PLACEMENT Right 08/20/2012   Procedure: CYSTOSCOPY WITH STENT PLACEMENT;  Surgeon: Franchot Gallo, MD;  Location: [facility];  Service: Urology;  Laterality: Right;  . CYSTOSCOPY WITH STENT PLACEMENT Left 08/01/2014   Procedure: CYSTOSCOPY WITH STENT PLACEMENT;  Surgeon: Franchot Gallo, MD;  Location: [place];  Service: Urology;  Laterality: Left;  . CYSTOSCOPY WITH URETEROSCOPY, STONE BASKETRY AND STENT PLACEMENT Right 09/21/2012   Procedure: CYSTOSCOPY WITH URETEROSCOPY, Retrograde Pyelogram;  Surgeon: Franchot Gallo, MD;  Location: [facility];  Service: Urology;  Laterality: Right;  . INNER EAR SURGERY Right yrs ago  . KNEE ARTHROSCOPY Left 2012  . NECKLIFT    . WRIST SURGERY Right 2010    Home Medications:  No medications prior to admission.    Allergies:  Allergies  Allergen Reactions  . Statins Other (See Comments)    Extremity weakness    Family History  Problem Relation Age of Onset  . ADD / ADHD Neg Hx   . Alcohol abuse Neg Hx   . Anxiety disorder Neg Hx   . Bipolar disorder Neg Hx   . Dementia Neg Hx   . Depression Neg Hx   . Drug abuse Neg Hx   . Schizophrenia Neg Hx   . Suicidality Neg Hx     Social History:  reports that she quit smoking about 38 years ago. Her smoking use included cigarettes. She has a 5.00 pack-year smoking history. She has never used smokeless tobacco. She reports that she does not drink alcohol or use drugs.  ROS: A complete review of systems was performed.  All systems are negative except for pertinent findings as noted.  Physical Exam:  Vital signs in last 24 hours:   General:  Alert and oriented, No acute distress HEENT: Normocephalic, atraumatic Neck: No JVD or lymphadenopathy Cardiovascular: Regular rate and rhythm Lungs: Clear bilaterally Abdomen: Soft, nontender, nondistended, no abdominal masses Back: No CVA tenderness Extremities: No edema Neurologic: Grossly  intact  Laboratory Data:  No results found for this or any previous visit (from the past 24 hour(s)). No results found for this or any previous visit (from the past 240 hour(s)). Creatinine: No results for input(s): CREATININE in the last 168 hours.  Radiologic Imaging: No results found.  Impression/Assessment:  Left ureteral/renal calculi, status post stenting  Plan:  Cystoscopy, left double-J stent extraction, left ureteroscopy, holmium laser lithotripsy/extraction  of all left-sided renal/ureteral calculi, double-J stent placement  Lillette Boxer Oza Oberle 07/07/2017, 9:37 AM  Lillette Boxer. Olando Willems MD

## 2017-07-07 NOTE — Discharge Instructions (Signed)
1. You may see some blood in the urine and may have some burning with urination for 48-72 hours. You also may notice that you have to urinate more frequently or urgently after your procedure which is normal.  2. You should call should you develop an inability urinate, fever > 101, persistent nausea and vomiting that prevents you from eating or drinking to stay hydrated.  If you have a stent, you will likely urinate more frequently and urgently until the stent is removed and you may experience some discomfort/pain in the lower abdomen and flank especially when urinating. You may take pain medication prescribed to you if needed for pain. You may also intermittently have blood in the urine until the stent is removed.  It is okay to remove the stent by pulling the string on Monday morning.

## 2017-07-07 NOTE — Anesthesia Postprocedure Evaluation (Signed)
Anesthesia Post Note  Patient: Glenda Klein  Procedure(s) Performed: CYSTOSCOPY WITH RETROGRADE PYELOGRAM, URETEROSCOPY AND STENT PLACEMENT, STENT EXCHANGE, ALSO STONE EXTRACTION\ (Left ) HOLMIUM LASER APPLICATION (Left )     Patient location during evaluation: PACU Anesthesia Type: General Level of consciousness: awake and alert Pain management: pain level controlled Vital Signs Assessment: post-procedure vital signs reviewed and stable Respiratory status: spontaneous breathing, nonlabored ventilation, respiratory function stable and patient connected to nasal cannula oxygen Cardiovascular status: blood pressure returned to baseline and stable Postop Assessment: no apparent nausea or vomiting Anesthetic complications: no    Last Vitals:  Vitals:   07/07/17 1520 07/07/17 1530  BP: 131/79 133/71  Pulse: 71 65  Resp: 19 17  Temp: 36.5 C   SpO2: 97% 100%    Last Pain:  Vitals:   07/07/17 1157  TempSrc: Oral                 Kailan Carmen COKER

## 2017-07-07 NOTE — Anesthesia Preprocedure Evaluation (Addendum)
Anesthesia Evaluation  Patient identified by MRN, date of birth, ID band Patient awake    Reviewed: Allergy & Precautions, NPO status , Patient's Chart, lab work & pertinent test results  Airway Mallampati: II  TM Distance: >3 FB     Dental   Pulmonary former smoker,    breath sounds clear to auscultation       Cardiovascular negative cardio ROS   Rhythm:Regular Rate:Normal     Neuro/Psych    GI/Hepatic hiatal hernia,   Endo/Other    Renal/GU Renal disease     Musculoskeletal  (+) Arthritis ,   Abdominal   Peds  Hematology  (+) anemia ,   Anesthesia Other Findings   Reproductive/Obstetrics                             Anesthesia Physical Anesthesia Plan  ASA: III  Anesthesia Plan: General   Post-op Pain Management:    Induction: Intravenous  PONV Risk Score and Plan: 3 and Treatment may vary due to age or medical condition, Ondansetron and Dexamethasone  Airway Management Planned: Oral ETT  Additional Equipment:   Intra-op Plan:   Post-operative Plan: Extubation in OR  Informed Consent: I have reviewed the patients History and Physical, chart, labs and discussed the procedure including the risks, benefits and alternatives for the proposed anesthesia with the patient or authorized representative who has indicated his/her understanding and acceptance.   Dental advisory given  Plan Discussed with: CRNA and Anesthesiologist  Anesthesia Plan Comments:        Anesthesia Quick Evaluation

## 2017-07-08 ENCOUNTER — Encounter (HOSPITAL_BASED_OUTPATIENT_CLINIC_OR_DEPARTMENT_OTHER): Payer: Self-pay | Admitting: Urology

## 2017-07-12 NOTE — Discharge Summary (Signed)
Patient ID: Glenda Klein MRN: [id number] DOB/AGE: Jul 08, 1945 72 y.o.  Admit date: 07/07/2017 Discharge date:17  Primary Care Physician:  Kelton Pillar, MD  Discharge Diagnoses: Ureteral calculus Present on Admission: . Ureteral calculus   Consults: None   Discharge Medications: Allergies as of 07/08/2017      Reactions   Statins Other (See Comments)   Extremity weakness      Medication List    ASK your doctor about these medications   alendronate 70 MG tablet Commonly known as:  FOSAMAX Take 70 mg by mouth once a week. On Monday   bimatoprost 0.03 % ophthalmic solution Commonly known as:  LATISSE Place 1 drop into both eyes at bedtime.   ezetimibe 10 MG tablet Commonly known as:  ZETIA Take 10 mg by mouth every morning.   HYDROcodone-acetaminophen 5-325 MG tablet Commonly known as:  NORCO/VICODIN Take 1 tablet by mouth every 6 (six) hours as needed for moderate pain.   NU-IRON 150 MG capsule Generic drug:  iron polysaccharides Take 150 mg by mouth every morning.   oxybutynin 5 MG tablet Commonly known as:  DITROPAN Take 5 mg by mouth every 8 (eight) hours as needed for bladder spasms.   phenazopyridine 200 MG tablet Commonly known as:  PYRIDIUM Take 200 mg by mouth 3 (three) times daily as needed for pain.   sertraline 100 MG tablet Commonly known as:  ZOLOFT Take 100 mg by mouth every morning.        Significant Diagnostic Studies:  No results found.  Brief H and P: For complete details please refer to admission H and P, but in brief the patient was admitted for ureteroscopic management of a left upper ureteral and left lower pole calyceal calculi  Hospital Course:  Active Problems:   Ureteral calculus Postoperatively the patient experienced no difficulties.  She was discharged on postoperative morning #1.  Day of Discharge BP 111/68 (BP Location: Left Arm)   Pulse 85   Temp 97.8 F (36.6 C)   Resp 16   Ht 5\' 4"  (1.626 m)   Wt 64.8  kg (142 lb 14.4 oz)   SpO2 97%   BMI 24.53 kg/m   No results found for this or any previous visit (from the past 24 hour(s)).  Physical Exam: General: Alert and awake oriented x3 not in any acute distress. HEENT: anicteric sclera, pupils reactive to light and accommodation CVS: S1-S2 clear no murmur rubs or gallops Chest: clear to auscultation bilaterally, no wheezing rales or rhonchi Abdomen: soft nontender, nondistended, normal bowel sounds, no organomegaly Extremities: no cyanosis, clubbing or edema noted bilaterally Neuro: Cranial nerves II-XII intact, no focal neurological deficits  Disposition: Home  Diet: Regular  Activity: No restrictions   Disposition and Follow-up:  Arranged for later  TESTS THAT NEED FOLLOW-UP None  DISCHARGE FOLLOW-UP Follow-up Information    Karen Kays, NP.   Specialty:  Nurse Practitioner Why:  5/23 @ 10 am Contact information: [address] [place] [address] [phone]           Time spent on Discharge:  5 minutes  Signed: Lillette Boxer Eon Zunker 07/12/2017, 12:25 PM

## 2017-07-13 DIAGNOSIS — N201 Calculus of ureter: Secondary | ICD-10-CM | POA: Diagnosis not present

## 2017-07-13 DIAGNOSIS — D649 Anemia, unspecified: Secondary | ICD-10-CM | POA: Diagnosis not present

## 2017-11-07 DIAGNOSIS — D649 Anemia, unspecified: Secondary | ICD-10-CM | POA: Diagnosis not present

## 2017-12-06 DIAGNOSIS — L821 Other seborrheic keratosis: Secondary | ICD-10-CM | POA: Diagnosis not present

## 2017-12-06 DIAGNOSIS — L814 Other melanin hyperpigmentation: Secondary | ICD-10-CM | POA: Diagnosis not present

## 2017-12-14 DIAGNOSIS — D509 Iron deficiency anemia, unspecified: Secondary | ICD-10-CM | POA: Diagnosis not present

## 2017-12-14 DIAGNOSIS — Z0000 Encounter for general adult medical examination without abnormal findings: Secondary | ICD-10-CM | POA: Diagnosis not present

## 2017-12-14 DIAGNOSIS — Z23 Encounter for immunization: Secondary | ICD-10-CM | POA: Diagnosis not present

## 2017-12-14 DIAGNOSIS — M810 Age-related osteoporosis without current pathological fracture: Secondary | ICD-10-CM | POA: Diagnosis not present

## 2017-12-14 DIAGNOSIS — F3340 Major depressive disorder, recurrent, in remission, unspecified: Secondary | ICD-10-CM | POA: Diagnosis not present

## 2017-12-14 DIAGNOSIS — E782 Mixed hyperlipidemia: Secondary | ICD-10-CM | POA: Diagnosis not present

## 2017-12-15 DIAGNOSIS — E782 Mixed hyperlipidemia: Secondary | ICD-10-CM | POA: Diagnosis not present

## 2017-12-15 DIAGNOSIS — M810 Age-related osteoporosis without current pathological fracture: Secondary | ICD-10-CM | POA: Diagnosis not present

## 2018-02-07 DIAGNOSIS — D649 Anemia, unspecified: Secondary | ICD-10-CM | POA: Diagnosis not present

## 2018-04-22 DIAGNOSIS — E785 Hyperlipidemia, unspecified: Secondary | ICD-10-CM | POA: Diagnosis not present

## 2018-04-22 DIAGNOSIS — R112 Nausea with vomiting, unspecified: Secondary | ICD-10-CM | POA: Diagnosis not present

## 2018-04-22 DIAGNOSIS — R1110 Vomiting, unspecified: Secondary | ICD-10-CM | POA: Diagnosis not present

## 2018-04-22 DIAGNOSIS — R195 Other fecal abnormalities: Secondary | ICD-10-CM | POA: Diagnosis not present

## 2018-04-22 DIAGNOSIS — R197 Diarrhea, unspecified: Secondary | ICD-10-CM | POA: Diagnosis not present

## 2018-04-23 DIAGNOSIS — R109 Unspecified abdominal pain: Secondary | ICD-10-CM | POA: Diagnosis not present

## 2018-06-12 DIAGNOSIS — D500 Iron deficiency anemia secondary to blood loss (chronic): Secondary | ICD-10-CM | POA: Diagnosis not present

## 2018-06-21 ENCOUNTER — Other Ambulatory Visit: Payer: Self-pay

## 2018-11-15 DIAGNOSIS — N3090 Cystitis, unspecified without hematuria: Secondary | ICD-10-CM | POA: Diagnosis not present

## 2018-11-15 DIAGNOSIS — M549 Dorsalgia, unspecified: Secondary | ICD-10-CM | POA: Diagnosis not present

## 2018-12-04 DIAGNOSIS — H26493 Other secondary cataract, bilateral: Secondary | ICD-10-CM | POA: Diagnosis not present

## 2018-12-18 DIAGNOSIS — M859 Disorder of bone density and structure, unspecified: Secondary | ICD-10-CM | POA: Diagnosis not present

## 2018-12-18 DIAGNOSIS — G609 Hereditary and idiopathic neuropathy, unspecified: Secondary | ICD-10-CM | POA: Diagnosis not present

## 2018-12-18 DIAGNOSIS — F3340 Major depressive disorder, recurrent, in remission, unspecified: Secondary | ICD-10-CM | POA: Diagnosis not present

## 2018-12-18 DIAGNOSIS — G43909 Migraine, unspecified, not intractable, without status migrainosus: Secondary | ICD-10-CM | POA: Diagnosis not present

## 2018-12-18 DIAGNOSIS — D649 Anemia, unspecified: Secondary | ICD-10-CM | POA: Diagnosis not present

## 2018-12-18 DIAGNOSIS — Z0000 Encounter for general adult medical examination without abnormal findings: Secondary | ICD-10-CM | POA: Diagnosis not present

## 2018-12-18 DIAGNOSIS — E782 Mixed hyperlipidemia: Secondary | ICD-10-CM | POA: Diagnosis not present

## 2018-12-18 DIAGNOSIS — D509 Iron deficiency anemia, unspecified: Secondary | ICD-10-CM | POA: Diagnosis not present

## 2018-12-18 DIAGNOSIS — M810 Age-related osteoporosis without current pathological fracture: Secondary | ICD-10-CM | POA: Diagnosis not present

## 2018-12-18 DIAGNOSIS — Z1389 Encounter for screening for other disorder: Secondary | ICD-10-CM | POA: Diagnosis not present

## 2018-12-18 DIAGNOSIS — F39 Unspecified mood [affective] disorder: Secondary | ICD-10-CM | POA: Diagnosis not present

## 2018-12-18 DIAGNOSIS — Z1159 Encounter for screening for other viral diseases: Secondary | ICD-10-CM | POA: Diagnosis not present

## 2018-12-20 DIAGNOSIS — H26492 Other secondary cataract, left eye: Secondary | ICD-10-CM | POA: Diagnosis not present

## 2018-12-27 DIAGNOSIS — R29890 Loss of height: Secondary | ICD-10-CM | POA: Diagnosis not present

## 2018-12-27 DIAGNOSIS — Z1231 Encounter for screening mammogram for malignant neoplasm of breast: Secondary | ICD-10-CM | POA: Diagnosis not present

## 2018-12-27 DIAGNOSIS — M8589 Other specified disorders of bone density and structure, multiple sites: Secondary | ICD-10-CM | POA: Diagnosis not present

## 2018-12-27 DIAGNOSIS — Z803 Family history of malignant neoplasm of breast: Secondary | ICD-10-CM | POA: Diagnosis not present

## 2019-01-24 DIAGNOSIS — H26491 Other secondary cataract, right eye: Secondary | ICD-10-CM | POA: Diagnosis not present

## 2019-02-15 DIAGNOSIS — F39 Unspecified mood [affective] disorder: Secondary | ICD-10-CM | POA: Diagnosis not present

## 2019-02-15 DIAGNOSIS — F418 Other specified anxiety disorders: Secondary | ICD-10-CM | POA: Diagnosis not present

## 2019-03-20 DIAGNOSIS — E559 Vitamin D deficiency, unspecified: Secondary | ICD-10-CM | POA: Diagnosis not present

## 2019-04-08 ENCOUNTER — Ambulatory Visit: Payer: PPO | Attending: Internal Medicine

## 2019-04-08 DIAGNOSIS — Z23 Encounter for immunization: Secondary | ICD-10-CM

## 2019-04-08 NOTE — Progress Notes (Signed)
   Covid-19 Vaccination Clinic  Name:  Glenda Klein    MRN: [id number] DOB: April 27, 1945  04/08/2019  [name] was observed post Covid-19 immunization for 15 minutes without incidence. She was provided with Vaccine Information Sheet and instruction to access the V-Safe system.   [name] was instructed to call 911 with any severe reactions post vaccine: Marland Kitchen Difficulty breathing  . Swelling of your face and throat  . A fast heartbeat  . A bad rash all over your body  . Dizziness and weakness    Immunizations Administered    Name Date Dose VIS Date Route   Pfizer COVID-19 Vaccine 04/08/2019  2:57 PM 0.3 mL 02/02/2019 Intramuscular   Manufacturer: [place]   Lot: [id number]   [place]: [id number]

## 2019-04-27 DIAGNOSIS — R112 Nausea with vomiting, unspecified: Secondary | ICD-10-CM | POA: Diagnosis not present

## 2019-04-27 DIAGNOSIS — K219 Gastro-esophageal reflux disease without esophagitis: Secondary | ICD-10-CM | POA: Diagnosis not present

## 2019-04-27 DIAGNOSIS — R1310 Dysphagia, unspecified: Secondary | ICD-10-CM | POA: Diagnosis not present

## 2019-05-01 ENCOUNTER — Ambulatory Visit: Payer: PPO | Attending: Internal Medicine

## 2019-05-01 DIAGNOSIS — Z23 Encounter for immunization: Secondary | ICD-10-CM

## 2019-05-01 NOTE — Progress Notes (Signed)
   Covid-19 Vaccination Clinic  Name:  Glenda Klein    MRN: [id number] DOB: Mar 21, 1945  05/01/2019  [name] was observed post Covid-19 immunization for 30 minutes based on pre-vaccination screening without incident. She was provided with Vaccine Information Sheet and instruction to access the V-Safe system.   [name] was instructed to call 911 with any severe reactions post vaccine: Marland Kitchen Difficulty breathing  . Swelling of face and throat  . A fast heartbeat  . A bad rash all over body  . Dizziness and weakness   Immunizations Administered    Name Date Dose VIS Date Route   Pfizer COVID-19 Vaccine 05/01/2019  4:02 PM 0.3 mL 02/02/2019 Intramuscular   Manufacturer: [place]   Lot: [id number]   [place]: [id number]

## 2019-05-02 ENCOUNTER — Ambulatory Visit: Payer: Self-pay

## 2019-05-02 ENCOUNTER — Other Ambulatory Visit: Payer: Self-pay | Admitting: Physician Assistant

## 2019-05-02 DIAGNOSIS — R1310 Dysphagia, unspecified: Secondary | ICD-10-CM

## 2019-05-04 DIAGNOSIS — Z022 Encounter for examination for admission to residential institution: Secondary | ICD-10-CM | POA: Diagnosis not present

## 2019-05-04 DIAGNOSIS — D649 Anemia, unspecified: Secondary | ICD-10-CM | POA: Diagnosis not present

## 2019-05-04 DIAGNOSIS — E559 Vitamin D deficiency, unspecified: Secondary | ICD-10-CM | POA: Diagnosis not present

## 2019-05-10 ENCOUNTER — Ambulatory Visit
Admission: RE | Admit: 2019-05-10 | Discharge: 2019-05-10 | Disposition: A | Discharge status: Home / Self Care | Payer: PPO | Source: Ambulatory Visit | Attending: Physician Assistant | Admitting: Physician Assistant

## 2019-05-10 DIAGNOSIS — K224 Dyskinesia of esophagus: Secondary | ICD-10-CM | POA: Diagnosis not present

## 2019-05-10 DIAGNOSIS — R1310 Dysphagia, unspecified: Secondary | ICD-10-CM

## 2019-05-10 DIAGNOSIS — K219 Gastro-esophageal reflux disease without esophagitis: Secondary | ICD-10-CM | POA: Diagnosis not present

## 2019-05-18 DIAGNOSIS — K449 Diaphragmatic hernia without obstruction or gangrene: Secondary | ICD-10-CM | POA: Diagnosis not present

## 2019-05-18 DIAGNOSIS — K224 Dyskinesia of esophagus: Secondary | ICD-10-CM | POA: Diagnosis not present

## 2019-05-18 DIAGNOSIS — K219 Gastro-esophageal reflux disease without esophagitis: Secondary | ICD-10-CM | POA: Diagnosis not present

## 2019-06-07 ENCOUNTER — Telehealth: Payer: Self-pay | Admitting: Gastroenterology

## 2019-06-07 NOTE — Telephone Encounter (Signed)
DOD 05/30/19   Dr. Tarri Glenn, this pt had her medical records transferred from [place].  She would like to be seen for GERD and would like to transfer care because she feels that her concerns are not being taken seriously.  She stated that whenever she eats a burger, she feels as if she has a heart attack.  Previous colon and EGD reports will be sent to you as well.  Please advise whether you will accept this patient.   Glenda Klein, [place].

## 2019-07-31 ENCOUNTER — Encounter: Payer: Self-pay | Admitting: Gastroenterology

## 2019-07-31 ENCOUNTER — Ambulatory Visit: Payer: PPO | Admitting: Gastroenterology

## 2019-07-31 VITALS — BP 108/68 | HR 70 | Ht 64.0 in | Wt 142.0 lb

## 2019-07-31 DIAGNOSIS — K219 Gastro-esophageal reflux disease without esophagitis: Secondary | ICD-10-CM

## 2019-07-31 DIAGNOSIS — K449 Diaphragmatic hernia without obstruction or gangrene: Secondary | ICD-10-CM | POA: Diagnosis not present

## 2019-07-31 DIAGNOSIS — K224 Dyskinesia of esophagus: Secondary | ICD-10-CM | POA: Diagnosis not present

## 2019-07-31 MED ORDER — PANTOPRAZOLE SODIUM 40 MG PO TBEC: 40.0000 mg | DELAYED_RELEASE_TABLET | Freq: Two times a day (BID) | ORAL | 3 refills | Status: DC

## 2019-07-31 NOTE — Progress Notes (Signed)
Referring Provider: Kelton Pillar, MD Primary Care Physician:  Kelton Pillar, MD  Reason for Consultation:  "I would like for you to surgical fix my hernia"   IMPRESSION:  Esophageal dysmotility noted on UGI series 05/10/19    - barium tablet passed without delay GERD with large hiatal hernia by UGI and EGD Esophageal dysmotility noted on recent UGI series Prior colonoscopy without history of polyps    - 2007 at [facility]    - 2017 at [facility] History of iron deficiency anemia No known family history of primary GI malignancies  Reflux, large hiatal hernia and noted esophageal dysmotility on recent UGI. May be GERD-related dysmotility. Will work to maximize treatment of GERD with increased PPI to BID dosing schedule. Will proceed with 24 hour pH probe and manometry for further evaluation of symptoms and dysmotility. Glenda Klein is keen on surgical referral, but, I recommended that we wait and review these results as they are available, as well as her clinical response to a higher dose of PPI therapy.    PLAN: Increase pantoprazole to 40 mg BID 24 hour pH probe and high resolution esophageal manometry Referral to surgery to consider fundoplication  Please see the "Patient Instructions" section for addition details about the plan.  HPI: Glenda Klein is a 74 y.o. female who wanted to establish care at [place]. The history is obtained through the patient, review of her electronic record, and review of 25 pages of records from [place] where Glenda Klein was followed by Dr. Watt Climes.  History of migraines, osteopenia, hyperlipidemia, bipolar disorder, depression, arthritis, and kidney stones.  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    Reports a 4-5 month history of concerning GI symptoms.  Last seen at [place] by PA Donna Christen 05/18/19 for hiatal hernia, GERD, and esophageal dysmotility. Primary symptoms have been vomiting, dysphagia,  GERD, post-prandial vomiting, regurgitation, sore throat. Nocturnal coughing spells.   Barium swallow showed large hiatal hernia, mild GERD, mild esophagitis, and esophageal dysmotility. Taking pantoprazole 40 mg daily with symptomatic relief - control of vomiting.  No regurgitation, dysphagia, postprandial abdominal pain,  odynophagia, sore throat, neck pain, dysphonia, anemia, and change in bowel habits or cough.  Good appetite. Has lost 10 pounds since the start of Covid.   No history of gastric volvulus, GI bleeding, GI obstruction, strangulation, perforation, or respiratory compromise.   Has not tried any other treatments. Has avoid foods that can trigger reflux after doing her research on the intermittent.   Not satisfied with medications. Wants a permanent resolution to her symptoms.   Colonoscopy in [place] at [facility] around 2007 EGD 06/24/2015 [facility]) for iron deficiency anemia showed a large hiatal hernia but was otherwise normal. Colonoscopy 06/24/2015 [facility]) for screening, iron deficiency anemia showed sigmoid diverticulosis. Repeat colonoscopy recommended in 10 years.   CBC and CMP normal 11/2018  UGI series 05/10/19: Large hiatal hernia. Mild reflux to the level of the midthoracic esophagus. Mild esophageal dysmotility. Barium tablet passed without delay.   No family history of GI disease.  But, family history is largely unknown.  Past Medical History:  Diagnosis Date  . Alopecia   . Arthritis   . Frequency of urination   . Hematuria   . Hiatal hernia   . History of kidney stones   . Hyperlipidemia   . Iron deficiency anemia   . MDD (major depressive disorder)   . Renal calculi  LEFT  . Ureteral calculus, left   . Urgency of urination     Past Surgical History:  Procedure Laterality Date  . CATARACT EXTRACTION W/ INTRAOCULAR LENS  IMPLANT, BILATERAL  2017 approx.  . CYSTOSCOPY W/ URETERAL STENT PLACEMENT Left 06/26/2017   Procedure: CYSTOSCOPY WITH LEFT  RETROGRADE PYELOGRAM/LEFT URETERAL STENT PLACEMENT;  Surgeon: Kathie Rhodes, MD;  Location: [facility];  Service: Urology;  Laterality: Left;  . CYSTOSCOPY WITH RETROGRADE PYELOGRAM, URETEROSCOPY AND STENT PLACEMENT Left 07/07/2017   Procedure: CYSTOSCOPY WITH RETROGRADE PYELOGRAM, URETEROSCOPY AND STENT PLACEMENT, STENT EXCHANGE, ALSO STONE EXTRACTION\;  Surgeon: Franchot Gallo, MD;  Location: [facility];  Service: Urology;  Laterality: Left;  . CYSTOSCOPY WITH STENT PLACEMENT Right 08/20/2012   Procedure: CYSTOSCOPY WITH STENT PLACEMENT;  Surgeon: Franchot Gallo, MD;  Location: [facility];  Service: Urology;  Laterality: Right;  . CYSTOSCOPY WITH STENT PLACEMENT Left 08/01/2014   Procedure: CYSTOSCOPY WITH STENT PLACEMENT;  Surgeon: Franchot Gallo, MD;  Location: [place];  Service: Urology;  Laterality: Left;  . CYSTOSCOPY WITH URETEROSCOPY, STONE BASKETRY AND STENT PLACEMENT Right 09/21/2012   Procedure: CYSTOSCOPY WITH URETEROSCOPY, Retrograde Pyelogram;  Surgeon: Franchot Gallo, MD;  Location: [facility];  Service: Urology;  Laterality: Right;  . HOLMIUM LASER APPLICATION Left 1/61/0960   Procedure: HOLMIUM LASER APPLICATION;  Surgeon: Franchot Gallo, MD;  Location: [facility];  Service: Urology;  Laterality: Left;  . INNER EAR SURGERY Right yrs ago  . KNEE ARTHROSCOPY Left 2012  . NECKLIFT    . WRIST SURGERY Right 2010    Current Outpatient Medications  Medication Sig Dispense Refill  . alendronate (FOSAMAX) 70 MG tablet Take 70 mg by mouth once a week. On Monday    . pantoprazole (PROTONIX) 40 MG tablet Take 40 mg by mouth daily.    . sertraline (ZOLOFT) 100 MG tablet Take 100 mg by mouth every morning.     No current facility-administered medications for this visit.    Allergies as of 07/31/2019 - Review Complete 07/31/2019  Allergen Reaction Noted  . Meloxicam Other (See Comments) 07/30/2019  . Statins Other (See Comments)  08/20/2012  . Welchol [colesevelam] Other (See Comments) 07/30/2019    Family History  Problem Relation Age of Onset  . Aneurysm Mother   . Heart attack Father   . Breast cancer Child   . ADD / ADHD Neg Hx   . Alcohol abuse Neg Hx   . Anxiety disorder Neg Hx   . Bipolar disorder Neg Hx   . Dementia Neg Hx   . Depression Neg Hx   . Drug abuse Neg Hx   . Schizophrenia Neg Hx   . Suicidality Neg Hx   . Colon cancer Neg Hx   . Colonic polyp Neg Hx   . Liver cancer Neg Hx     Social History   Socioeconomic History  . Marital status: Divorced    Spouse name: Not on file  . Number of children: Not on file  . Years of education: Not on file  . Highest education level: Not on file  Occupational History  . Not on file  Tobacco Use  . Smoking status: Former Smoker    Packs/day: 0.50    Years: 10.00    Pack years: 5.00    Types: Cigarettes    Quit date: 09/15/1978    Years since quitting: 40.9  . Smokeless tobacco: Never Used  Substance and Sexual Activity  . Alcohol use: No  .  Drug use: No  . Sexual activity: Not on file  Other Topics Concern  . Not on file  Social History Narrative  . Not on file   Social Determinants of Health   Financial Resource Strain:   . Difficulty of Paying Living Expenses:   Food Insecurity:   . Worried About Charity fundraiser in the Last Year:   . Arboriculturist in the Last Year:   Transportation Needs:   . Film/video editor (Medical):   Marland Kitchen Lack of Transportation (Non-Medical):   Physical Activity:   . Days of Exercise per Week:   . Minutes of Exercise per Session:   Stress:   . Feeling of Stress :   Social Connections:   . Frequency of Communication with Friends and Family:   . Frequency of Social Gatherings with Friends and Family:   . Attends Religious Services:   . Active Member of Clubs or Organizations:   . Attends Archivist Meetings:   Marland Kitchen Marital Status:   Intimate Partner Violence:   . Fear of Current or  Ex-Partner:   . Emotionally Abused:   Marland Kitchen Physically Abused:   . Sexually Abused:     Review of Systems: 12 system ROS is negative except as noted above.   Physical Exam: General:   Alert,  well-nourished, pleasant and cooperative in NAD Head:  Normocephalic and atraumatic. Eyes:  Sclera clear, no icterus.   Conjunctiva pink. Ears:  Normal auditory acuity. Nose:  No deformity, discharge,  or lesions. Mouth:  No deformity or lesions.   Neck:  Supple; no masses or thyromegaly. Abdomen:  Soft, nontender, nondistended, normal bowel sounds, no rebound or guarding. No hepatosplenomegaly.   Msk:  Symmetrical. No boney deformities LAD: No inguinal or umbilical LAD Extremities:  No clubbing or edema. Neurologic:  Alert and  oriented x4;  grossly nonfocal Skin:  No rash or bruise.  Psych:  Alert and cooperative. Normal mood and affect.    Dorna Mallet L. Tarri Glenn, MD, MPH 07/31/2019, 2:39 PM

## 2019-07-31 NOTE — Patient Instructions (Addendum)
I have recommended that you increase your pantoprazole to 40 mg twice daily - taken 30-60 minutes prior to meals.  There are several other things that may minimize your symptoms: - Avoid any dietary triggers - Avoid spicy and acidic foods - Limit your intake of coffee, tea, alcohol, and carbonated drinks - Work to maintain a healthy weight - Keep the head of the bed elevated with blocks if you are having any nighttime symptoms - Stay upright for 2 hours after eating - Avoid meals and snacks three to four hours before bedtime __________________________________________________________________________________________________  Due to recent COVID-19 restrictions implemented by our local and state authorities and in an effort to keep both patients and staff as safe as possible, our hospital system now requires COVID-19 testing prior to any scheduled hospital procedure. Please go to our [facility] location drive thru testing site ([address] [place], [place] [address]) on 8/13/2021at  8:10am. There will be multiple testing areas, the first checkpoint being for pre-procedure/surgery testing. Get into the right (yellow) lane that leads to the PAT testing team. You will not be billed at the time of testing but may receive a bill later depending on your insurance. The approximate cost of the test is $100. You must agree to quarantine from the time of your testing until the procedure date on 10/10/2019 . This should include staying at home with ONLY the people you live with. Avoid take-out, grocery store shopping or leaving the house for any non-emergent reason. Failure to have your COVID-19 test done on the date and time you have been scheduled will result in cancellation of procedure. Please call our office at [phone] if you have any questions.   I have recommended some testing of your esophagus to be sure that the acid levels are under control and that your esophageal muscles are working  together.   Those results will help [place] make additional recommendations and to determine if it would be beneficial to see a surgeon.  You have been scheduled for an esophageal manometry at [facility] on 10/10/2019 at 10:30am. Please arrive 30 minutes prior to your procedure for registration. You will need to go to outpatient registration (1st floor of the hospital) first. Make certain to bring your insurance cards as well as a complete list of medications.  Please remember the following:  1) Do not take any muscle relaxants, xanax (alprazolam) or ativan for 1 day prior to your test as well as the day of the test.  2) Nothing to eat or drink for 4 hours before your test.  3) Hold all diabetic medications/insulin the morning of the test. You may eat and take your medications after the test.  It will take at least 2 weeks to receive the results of this test from your physician. ------------------------------------------ ABOUT ESOPHAGEAL MANOMETRY Esophageal manometry (muh-NOM-uh-tree) is a test that gauges how well your esophagus works. Your esophagus is the long, muscular tube that connects your throat to your stomach. Esophageal manometry measures the rhythmic muscle contractions (peristalsis) that occur in your esophagus when you swallow. Esophageal manometry also measures the coordination and force exerted by the muscles of your esophagus.  During esophageal manometry, a thin, flexible tube (catheter) that contains sensors is passed through your nose, down your esophagus and into your stomach. Esophageal manometry can be helpful in diagnosing some mostly uncommon disorders that affect your esophagus.  Why it's done Esophageal manometry is used to evaluate the movement (motility) of food through the  esophagus and into the stomach. The test measures how well the circular bands of muscle (sphincters) at the top and bottom of your esophagus open and close, as well as the pressure, strength  and pattern of the wave of esophageal muscle contractions that moves food along.  What you can expect Esophageal manometry is an outpatient procedure done without sedation. Most people tolerate it well. You may be asked to change into a hospital gown before the test starts.  During esophageal manometry  . While you are sitting up, a member of your health care team sprays your throat with a numbing medication or puts numbing gel in your nose or both.  . A catheter is guided through your nose into your esophagus. The catheter may be sheathed in a water-filled sleeve. It doesn't interfere with your breathing. However, your eyes may water, and you may gag. You may have a slight nosebleed from irritation.  . After the catheter is in place, you may be asked to lie on your back on an exam table, or you may be asked to remain seated.  . You then swallow small sips of water. As you do, a computer connected to the catheter records the pressure, strength and pattern of your esophageal muscle contractions.  . During the test, you'll be asked to breathe slowly and smoothly, remain as still as possible, and swallow only when you're asked to do so.  . A member of your health care team may move the catheter down into your stomach while the catheter continues its measurements.  . The catheter then is slowly withdrawn. The test usually lasts 20 to 30 minutes.  After esophageal manometry  When your esophageal manometry is complete, you may return to your normal activities  This test typically takes 30-45 minutes to complete. ________________________________________________________________________________ Due to recent changes in healthcare laws, you may see the results of your imaging and laboratory studies on MyChart before your provider has had a chance to review them.  We understand that in some cases there may be results that are confusing or concerning to you. Not all laboratory results come back in the same time  frame and the provider may be waiting for multiple results in order to interpret others.  Please give [place] 48 hours in order for your provider to thoroughly review all the results before contacting the office for clarification of your results.

## 2019-09-11 ENCOUNTER — Telehealth: Payer: Self-pay | Admitting: Gastroenterology

## 2019-09-11 NOTE — Telephone Encounter (Signed)
Pt wanted to make sure she is scheduled for EM with 24 hour ph probe. Discussed with pt that she is scheduled for those procedures at [facility]. Pt verbalized understanding.

## 2019-09-11 NOTE — Telephone Encounter (Signed)
Patient called states the documentation she received is not what Dr. Tarri Glenn described so she is concerned and she is scheduled for the right thing

## 2019-09-19 NOTE — Progress Notes (Signed)
LVM to inform the pt of the Covid testing site change of location to [place] on 09/24/19. Please show up at the scheduled time, and this new site is still a drive- thru. Please contact your ordering provider with any other questions/concerns.

## 2019-10-05 ENCOUNTER — Other Ambulatory Visit (HOSPITAL_COMMUNITY)
Admission: RE | Admit: 2019-10-05 | Discharge: 2019-10-05 | Disposition: A | Discharge status: Home / Self Care | Payer: PPO | Source: Ambulatory Visit | Attending: Gastroenterology | Admitting: Gastroenterology

## 2019-10-05 DIAGNOSIS — Z20822 Contact with and (suspected) exposure to covid-19: Secondary | ICD-10-CM | POA: Diagnosis not present

## 2019-10-05 DIAGNOSIS — Z01812 Encounter for preprocedural laboratory examination: Secondary | ICD-10-CM | POA: Insufficient documentation

## 2019-10-05 LAB — SARS CORONAVIRUS 2 (TAT 6-24 HRS): SARS Coronavirus 2: NEGATIVE

## 2019-10-10 ENCOUNTER — Encounter (HOSPITAL_COMMUNITY)
Admission: RE | Disposition: A | Discharge status: Home / Self Care | Payer: Self-pay | Source: Home / Self Care | Attending: Gastroenterology

## 2019-10-10 ENCOUNTER — Ambulatory Visit (HOSPITAL_COMMUNITY)
Admission: RE | Admit: 2019-10-10 | Discharge: 2019-10-10 | Disposition: A | Discharge status: Home / Self Care | Payer: PPO | Attending: Gastroenterology | Admitting: Gastroenterology

## 2019-10-10 DIAGNOSIS — M1990 Unspecified osteoarthritis, unspecified site: Secondary | ICD-10-CM | POA: Insufficient documentation

## 2019-10-10 DIAGNOSIS — R05 Cough: Secondary | ICD-10-CM

## 2019-10-10 DIAGNOSIS — R1310 Dysphagia, unspecified: Secondary | ICD-10-CM | POA: Diagnosis not present

## 2019-10-10 DIAGNOSIS — Z888 Allergy status to other drugs, medicaments and biological substances status: Secondary | ICD-10-CM | POA: Diagnosis not present

## 2019-10-10 DIAGNOSIS — Z800 Family history of malignant neoplasm of digestive organs: Secondary | ICD-10-CM | POA: Insufficient documentation

## 2019-10-10 DIAGNOSIS — K449 Diaphragmatic hernia without obstruction or gangrene: Secondary | ICD-10-CM | POA: Insufficient documentation

## 2019-10-10 DIAGNOSIS — K224 Dyskinesia of esophagus: Secondary | ICD-10-CM | POA: Insufficient documentation

## 2019-10-10 DIAGNOSIS — Z886 Allergy status to analgesic agent status: Secondary | ICD-10-CM | POA: Diagnosis not present

## 2019-10-10 DIAGNOSIS — Z87891 Personal history of nicotine dependence: Secondary | ICD-10-CM | POA: Insufficient documentation

## 2019-10-10 DIAGNOSIS — F329 Major depressive disorder, single episode, unspecified: Secondary | ICD-10-CM | POA: Insufficient documentation

## 2019-10-10 DIAGNOSIS — Z7983 Long term (current) use of bisphosphonates: Secondary | ICD-10-CM | POA: Diagnosis not present

## 2019-10-10 DIAGNOSIS — Z79899 Other long term (current) drug therapy: Secondary | ICD-10-CM | POA: Diagnosis not present

## 2019-10-10 DIAGNOSIS — R059 Cough, unspecified: Secondary | ICD-10-CM

## 2019-10-10 DIAGNOSIS — K219 Gastro-esophageal reflux disease without esophagitis: Secondary | ICD-10-CM | POA: Diagnosis not present

## 2019-10-10 HISTORY — PX: 24 HOUR PH STUDY: SHX5419

## 2019-10-10 HISTORY — PX: ESOPHAGEAL MANOMETRY: SHX5429

## 2019-10-10 SURGERY — MANOMETRY, ESOPHAGUS

## 2019-10-10 MED ORDER — LIDOCAINE VISCOUS HCL 2 % MT SOLN
OROMUCOSAL | Status: AC
  Filled 2019-10-10: qty 15

## 2019-10-10 SURGICAL SUPPLY — 2 items
FACESHIELD LNG OPTICON STERILE (SAFETY) IMPLANT
GLOVE BIO SURGEON STRL SZ8 (GLOVE) ×6 IMPLANT

## 2019-10-10 NOTE — Progress Notes (Signed)
Esophageal manometry procedure performed per protocol without complications.  Patient tolerated well.  PH probed placed at 31 cm per protocol without complications.  Patient tolerated well.  Patient to return at 1145 am tomorrow to have probed removed.  Patient verbalized understanding of teaching and to return with diary tomorrow,.\\.

## 2019-10-10 NOTE — H&P (Signed)
Referring Provider: No ref. provider found Primary Care Physician:  Kelton Pillar, MD  Reason for Consultation:  "I would like for you to surgical fix my hernia"   IMPRESSION:  Esophageal dysmotility noted on UGI series 05/10/19    - barium tablet passed without delay GERD with large hiatal hernia by UGI and EGD Esophageal dysmotility noted on recent UGI series Prior colonoscopy without history of polyps    - 2007 at [facility]    - 2017 at [facility] History of iron deficiency anemia No known family history of primary GI malignancies  Reflux, large hiatal hernia and noted esophageal dysmotility on recent UGI. May be GERD-related dysmotility. Will work to maximize treatment of GERD with increased PPI to BID dosing schedule. Will proceed with 24 hour pH probe and manometry for further evaluation of symptoms and dysmotility. She is keen on surgical referral, but, I recommended that we wait and review these results as they are available, as well as her clinical response to a higher dose of PPI therapy.    PLAN: Increase pantoprazole to 40 mg BID 24 hour pH probe and high resolution esophageal manometry Referral to surgery to consider fundoplication  Please see the "Patient Instructions" section for addition details about the plan.  HPI: KALIN AMRHEIN is a 74 y.o. female who wanted to establish care at [facility]. The history is obtained through the patient, review of her electronic record, and review of 25 pages of records from [place] where she was followed by Dr. Watt Climes.  History of migraines, osteopenia, hyperlipidemia, bipolar disorder, depression, arthritis, and kidney stones.  She is a retired Engineer, maintenance (IT) who previously worked for [employer] including [place], [place], [employer].    Reports a 4-5 month history of concerning GI symptoms.  Last seen at [facility] by PA Donna Christen 05/18/19 for hiatal hernia, GERD, and esophageal dysmotility. Primary symptoms have been vomiting,  dysphagia, GERD, post-prandial vomiting, regurgitation, sore throat. Nocturnal coughing spells.   Barium swallow showed large hiatal hernia, mild GERD, mild esophagitis, and esophageal dysmotility. Taking pantoprazole 40 mg daily with symptomatic relief - control of vomiting.  No regurgitation, dysphagia, postprandial abdominal pain,  odynophagia, sore throat, neck pain, dysphonia, anemia, and change in bowel habits or cough.  Good appetite. Has lost 10 pounds since the start of Covid.   No history of gastric volvulus, GI bleeding, GI obstruction, strangulation, perforation, or respiratory compromise.   Has not tried any other treatments. Has avoid foods that can trigger reflux after doing her research on the intermittent.   Not satisfied with medications. Wants a permanent resolution to her symptoms.   Colonoscopy in [place] at [facility] around 2007 EGD 06/24/2015 [facility]) for iron deficiency anemia showed a large hiatal hernia but was otherwise normal. Colonoscopy 06/24/2015 [facility]) for screening, iron deficiency anemia showed sigmoid diverticulosis. Repeat colonoscopy recommended in 10 years.   CBC and CMP normal 11/2018  UGI series 05/10/19: Large hiatal hernia. Mild reflux to the level of the midthoracic esophagus. Mild esophageal dysmotility. Barium tablet passed without delay.   No family history of GI disease.  But, family history is largely unknown.  Past Medical History:  Diagnosis Date   Alopecia    Arthritis    Frequency of urination    Hematuria    Hiatal hernia    History of kidney stones    Hyperlipidemia    Iron deficiency anemia    MDD (major depressive disorder)    Renal calculi  LEFT   Ureteral calculus, left    Urgency of urination     Past Surgical History:  Procedure Laterality Date   CATARACT EXTRACTION W/ INTRAOCULAR LENS  IMPLANT, BILATERAL  2017 approx.   CYSTOSCOPY W/ URETERAL STENT PLACEMENT Left 06/26/2017   Procedure: CYSTOSCOPY  WITH LEFT RETROGRADE PYELOGRAM/LEFT URETERAL STENT PLACEMENT;  Surgeon: Kathie Rhodes, MD;  Location: [facility];  Service: Urology;  Laterality: Left;   CYSTOSCOPY WITH RETROGRADE PYELOGRAM, URETEROSCOPY AND STENT PLACEMENT Left 07/07/2017   Procedure: CYSTOSCOPY WITH RETROGRADE PYELOGRAM, URETEROSCOPY AND STENT PLACEMENT, STENT EXCHANGE, ALSO STONE EXTRACTION\;  Surgeon: Franchot Gallo, MD;  Location: [facility];  Service: Urology;  Laterality: Left;   CYSTOSCOPY WITH STENT PLACEMENT Right 08/20/2012   Procedure: CYSTOSCOPY WITH STENT PLACEMENT;  Surgeon: Franchot Gallo, MD;  Location: [facility];  Service: Urology;  Laterality: Right;   CYSTOSCOPY WITH STENT PLACEMENT Left 08/01/2014   Procedure: CYSTOSCOPY WITH STENT PLACEMENT;  Surgeon: Franchot Gallo, MD;  Location: [facility];  Service: Urology;  Laterality: Left;   CYSTOSCOPY WITH URETEROSCOPY, STONE BASKETRY AND STENT PLACEMENT Right 09/21/2012   Procedure: CYSTOSCOPY WITH URETEROSCOPY, Retrograde Pyelogram;  Surgeon: Franchot Gallo, MD;  Location: [facility];  Service: Urology;  Laterality: Right;   HOLMIUM LASER APPLICATION Left 11/14/3005   Procedure: HOLMIUM LASER APPLICATION;  Surgeon: Franchot Gallo, MD;  Location: [facility];  Service: Urology;  Laterality: Left;   INNER EAR SURGERY Right yrs ago   KNEE ARTHROSCOPY Left 2012   NECKLIFT     WRIST SURGERY Right 2010    No current facility-administered medications for this encounter.   Current Outpatient Medications  Medication Sig Dispense Refill   alendronate (FOSAMAX) 70 MG tablet Take 70 mg by mouth once a week. On Monday     calcium carbonate (OS-CAL) 1250 (500 Ca) MG chewable tablet Chew 1 tablet by mouth daily.     ferrous sulfate 325 (65 FE) MG tablet Take 325 mg by mouth as needed.     Multiple Vitamin (MULTIVITAMIN) tablet Take 1 tablet by mouth daily.     pantoprazole (PROTONIX) 40 MG tablet Take 1 tablet (40  mg total) by mouth 2 (two) times daily. 180 tablet 3   sertraline (ZOLOFT) 100 MG tablet Take 100 mg by mouth every morning.      Allergies as of 07/31/2019 - Review Complete 07/31/2019  Allergen Reaction Noted   Meloxicam Other (See Comments) 07/30/2019   Statins Other (See Comments) 08/20/2012   Welchol [colesevelam] Other (See Comments) 07/30/2019    Family History  Problem Relation Age of Onset   Aneurysm Mother    Heart attack Father    Breast cancer Child    ADD / ADHD Neg Hx    Alcohol abuse Neg Hx    Anxiety disorder Neg Hx    Bipolar disorder Neg Hx    Dementia Neg Hx    Depression Neg Hx    Drug abuse Neg Hx    Schizophrenia Neg Hx    Suicidality Neg Hx    Colon cancer Neg Hx    Colonic polyp Neg Hx    Liver cancer Neg Hx     Social History   Socioeconomic History   Marital status: Divorced    Spouse name: Not on file   Number of children: Not on file   Years of education: Not on file   Highest education level: Not on file  Occupational History   Not on file  Tobacco  Use   Smoking status: Former Smoker    Packs/day: 0.50    Years: 10.00    Pack years: 5.00    Types: Cigarettes    Quit date: 09/15/1978    Years since quitting: 41.0   Smokeless tobacco: Never Used  Substance and Sexual Activity   Alcohol use: No   Drug use: No   Sexual activity: Not on file  Other Topics Concern   Not on file  Social History Narrative   Not on file   Social Determinants of Health   Financial Resource Strain:    Difficulty of Paying Living Expenses:   Food Insecurity:    Worried About Charity fundraiser in the Last Year:    Arboriculturist in the Last Year:   Transportation Needs:    Film/video editor (Medical):    Lack of Transportation (Non-Medical):   Physical Activity:    Days of Exercise per Week:    Minutes of Exercise per Session:   Stress:    Feeling of Stress :   Social Connections:    Frequency of  Communication with Friends and Family:    Frequency of Social Gatherings with Friends and Family:    Attends Religious Services:    Active Member of Clubs or Organizations:    Attends Music therapist:    Marital Status:   Intimate Partner Violence:    Fear of Current or Ex-Partner:    Emotionally Abused:    Physically Abused:    Sexually Abused:     Review of Systems: 12 system ROS is negative except as noted above.   Physical Exam: General:   Alert,  well-nourished, pleasant and cooperative in NAD Head:  Normocephalic and atraumatic. Eyes:  Sclera clear, no icterus.   Conjunctiva pink. Ears:  Normal auditory acuity. Nose:  No deformity, discharge,  or lesions. Mouth:  No deformity or lesions.   Neck:  Supple; no masses or thyromegaly. Abdomen:  Soft, nontender, nondistended, normal bowel sounds, no rebound or guarding. No hepatosplenomegaly.   Msk:  Symmetrical. No boney deformities LAD: No inguinal or umbilical LAD Extremities:  No clubbing or edema. Neurologic:  Alert and  oriented x4;  grossly nonfocal Skin:  No rash or bruise.  Psych:  Alert and cooperative. Normal mood and affect.    Sapphira Harjo L. Tarri Glenn, MD, MPH 10/10/2019, 4:36 PM

## 2019-10-12 ENCOUNTER — Encounter (HOSPITAL_COMMUNITY): Payer: Self-pay | Admitting: Gastroenterology

## 2019-10-23 DIAGNOSIS — R1310 Dysphagia, unspecified: Secondary | ICD-10-CM

## 2019-10-23 DIAGNOSIS — R059 Cough, unspecified: Secondary | ICD-10-CM

## 2019-10-23 DIAGNOSIS — K219 Gastro-esophageal reflux disease without esophagitis: Secondary | ICD-10-CM

## 2019-11-02 ENCOUNTER — Telehealth: Payer: Self-pay | Admitting: Gastroenterology

## 2019-11-02 NOTE — Telephone Encounter (Signed)
I believe this was answered elsewhere, but, replying to make sure that Glenda Klein gets the results.  The results from her 24-hour pH probe and esophageal manometry were overall reassuring.  Esophageal manometry was normal.   24-hour pH probe showed good acid suppression on PPI.  There was no evidence of significant gastroesophageal nonacid reflux.   Overall these results are reassuring.  If she is still interested in considering surgical repair of her hiatal hernia please offer a consultation with [facility].  Thank you.

## 2019-11-02 NOTE — Telephone Encounter (Signed)
See additional phone note. 

## 2019-11-02 NOTE — Telephone Encounter (Signed)
Pt calling for EM ph test results in August. Please advise.

## 2020-01-07 ENCOUNTER — Other Ambulatory Visit: Payer: Self-pay

## 2020-01-07 ENCOUNTER — Telehealth: Payer: Self-pay | Admitting: Gastroenterology

## 2020-01-07 DIAGNOSIS — K449 Diaphragmatic hernia without obstruction or gangrene: Secondary | ICD-10-CM

## 2020-01-07 DIAGNOSIS — K219 Gastro-esophageal reflux disease without esophagitis: Secondary | ICD-10-CM

## 2020-01-07 MED ORDER — PANTOPRAZOLE SODIUM 40 MG PO TBEC: 40.0000 mg | DELAYED_RELEASE_TABLET | Freq: Two times a day (BID) | ORAL | 3 refills | Status: AC

## 2020-01-07 NOTE — Telephone Encounter (Signed)
Outpatient Medication Detail   Disp Refills Start End   pantoprazole (PROTONIX) 40 MG tablet 180 tablet 3 01/07/2020    Sig - Route: Take 1 tablet (40 mg total) by mouth 2 (two) times daily. - Oral   Sent to pharmacy as: pantoprazole (PROTONIX) 40 MG tablet   E-Prescribing Status: Receipt confirmed by pharmacy (01/07/2020 10:18 AM EST)

## 2020-05-16 IMAGING — RF DG ESOPHAGUS
8 of 10 series · 14 of 24 positions shown · non-contrast
Comparison: 06/26/2017 chest CT angiogram.

CLINICAL DATA: Chronic dysphagia with intermittent vomiting and
intermittent globus sensation in the chest.

EXAM:
ESOPHOGRAM / BARIUM SWALLOW / BARIUM TABLET STUDY
TECHNIQUE: Combined double contrast and single contrast examination performed
using effervescent crystals, thick barium liquid, and thin barium
liquid. The patient was observed with fluoroscopy swallowing a 13 mm
barium sulphate tablet.
FLUOROSCOPY TIME:  Fluoroscopy Time:  2 minutes 24 seconds
Radiation Exposure Index (if provided by the fluoroscopic device):
87 mGy
Number of Acquired Spot Images: 9

[Series 1: sequence · 2 of 19 frames shown (1 of 6)]
[frame 1/19]
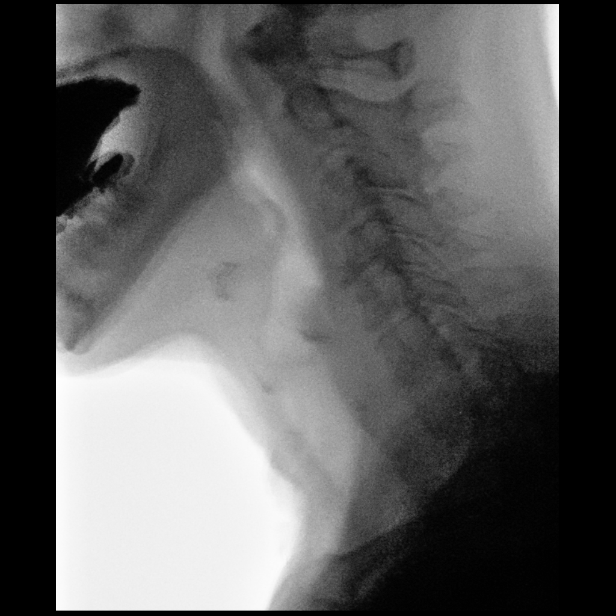
[frame 17/19]
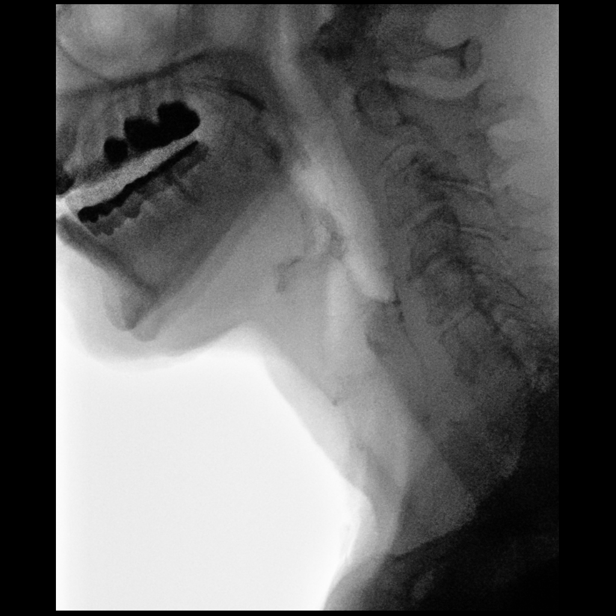

[Series 3: sequence · 1 of 16 frames shown (2 of 6)]
[frame 8/16]
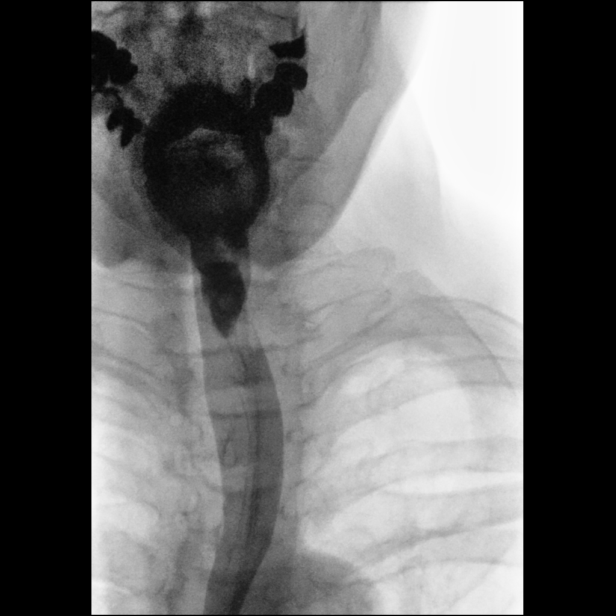

[Series 4: one shot · 0.15mm/px · 3 of 7 slices shown (1 of 2)]
[im 1/7]
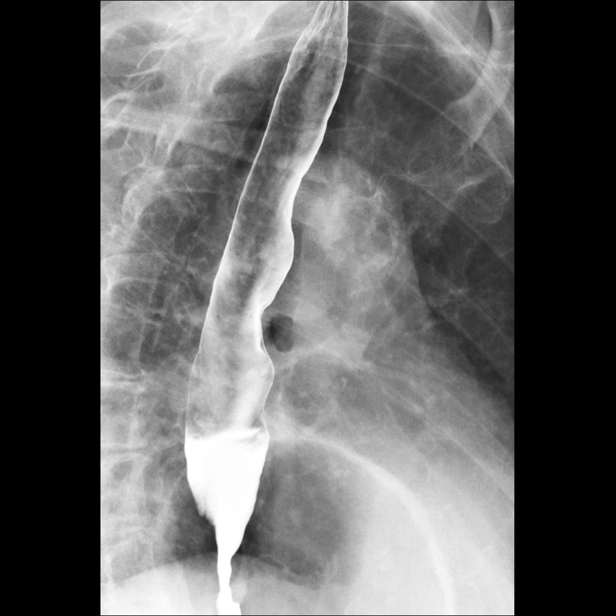
[im 2/7]
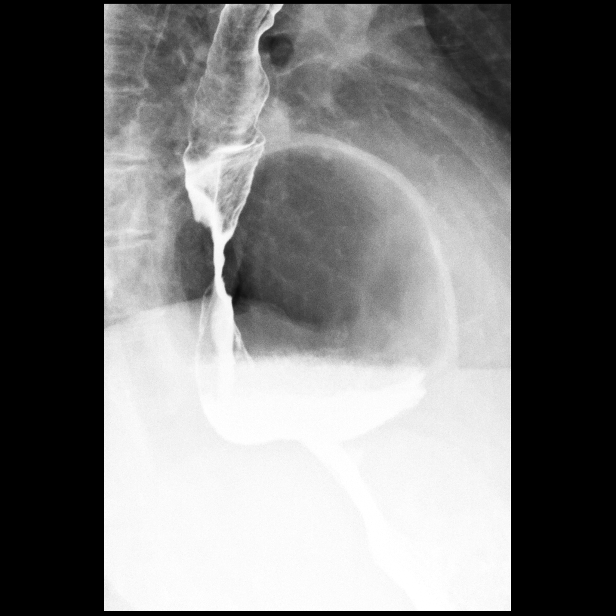
[im 5/7]
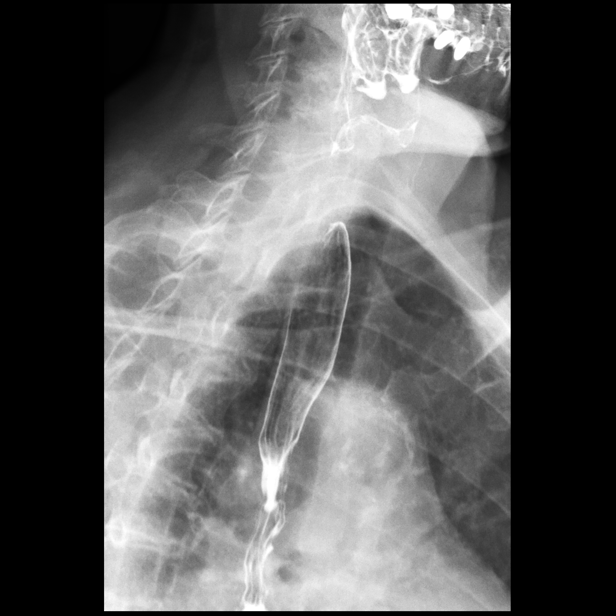

[Series 5: sequence · 2 of 43 frames shown (3 of 6)]
[frame 7/43]
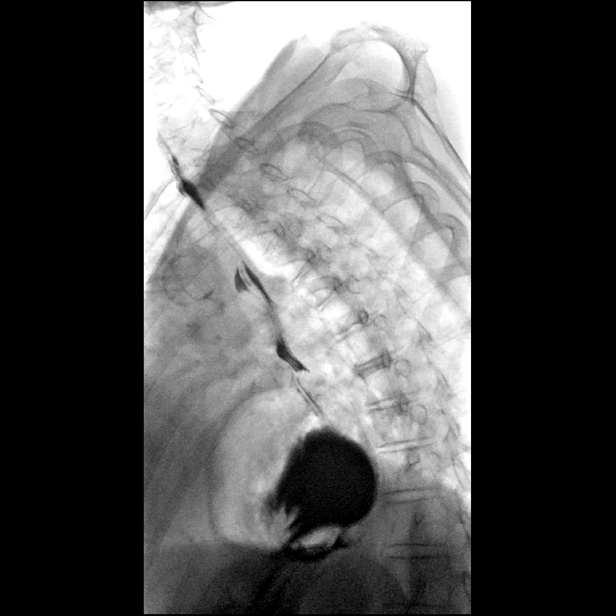
[frame 13/43]
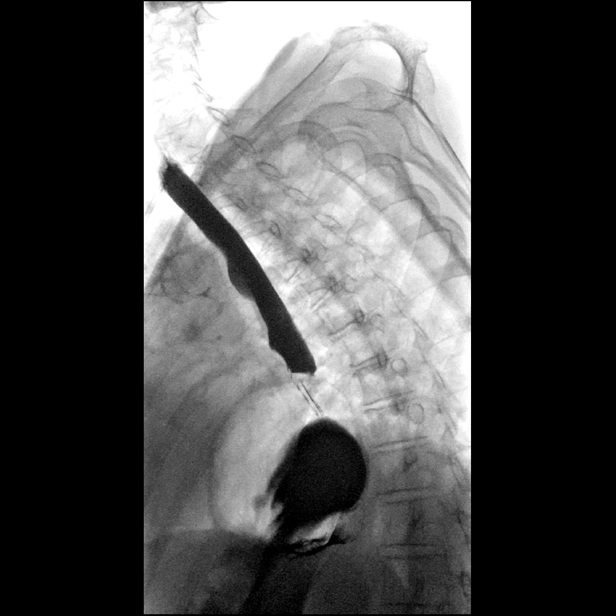

[Series 6: sequence · 2 of 111 frames shown (4 of 6)]
[frame 9/111]
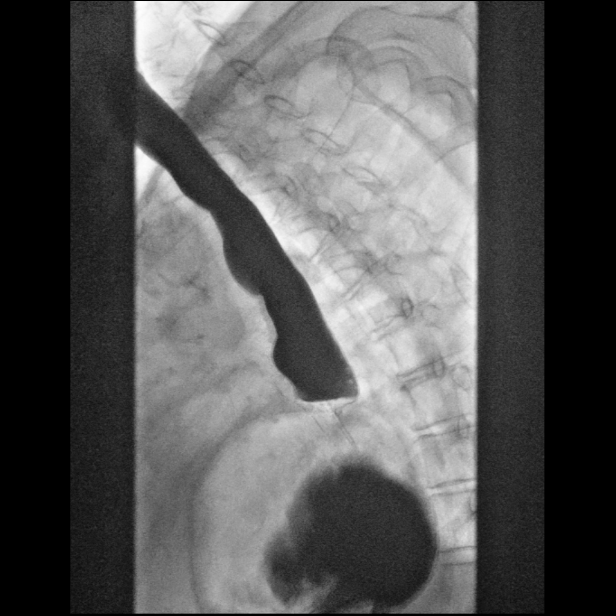
[frame 95/111]
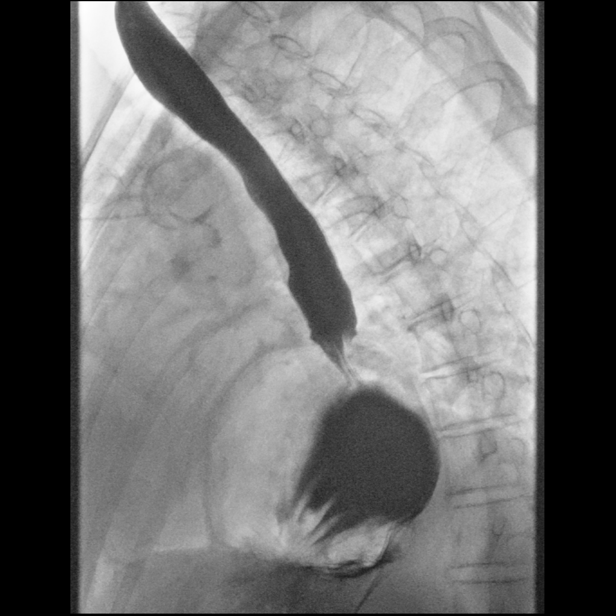

[Series 8: sequence · 2 of 6 frames shown (5 of 6)]
[frame 4/6]
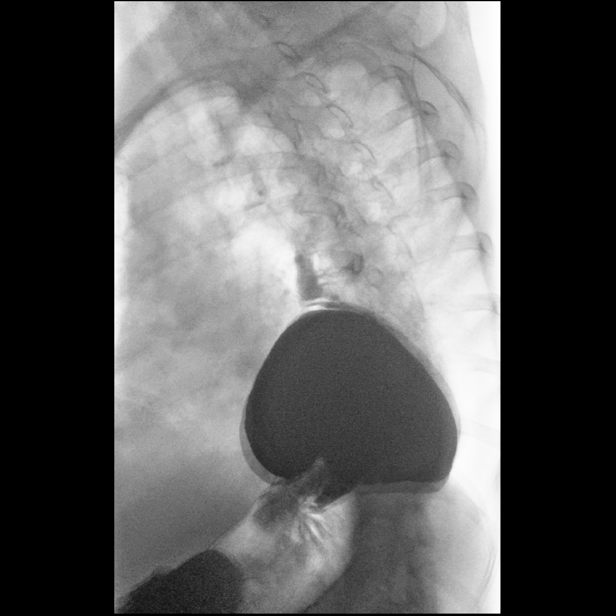
[frame 6/6]
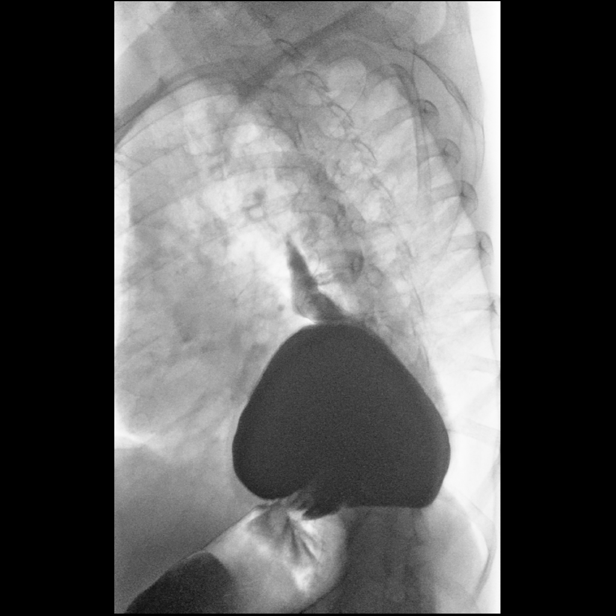

[Series 10: sequence · 1 of 24 frames shown (6 of 6)]
[frame 13/24]
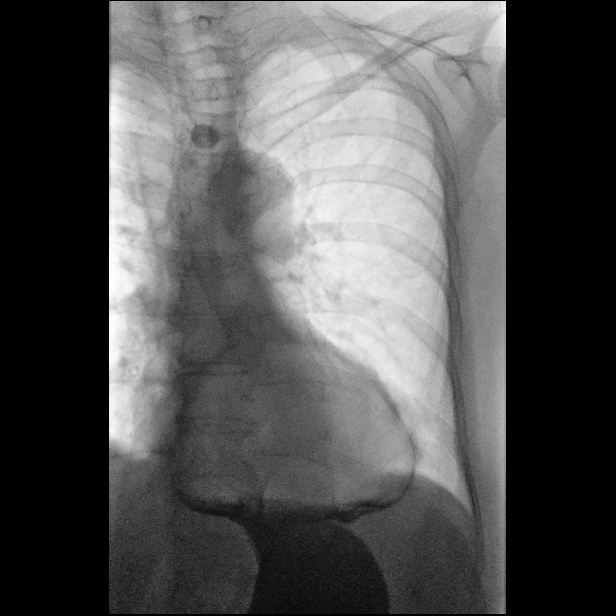

[Series 11: one shot · 1 of 1 slices shown (2 of 2)]
[im 1/1]
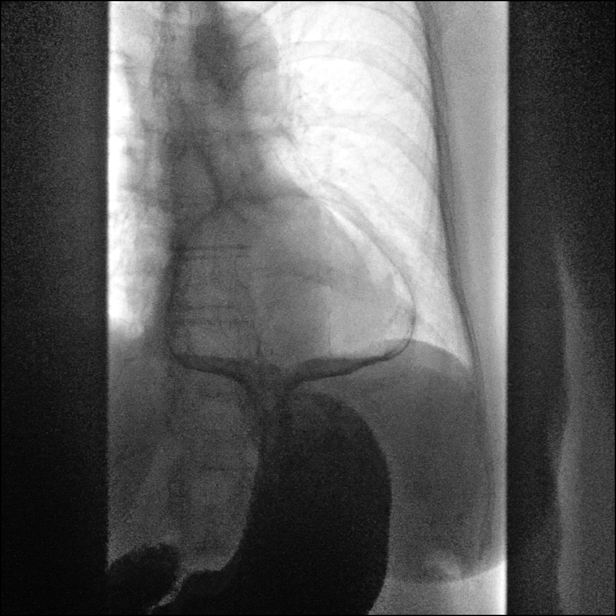

[14 of 24 positions shown; findings below may reference images not displayed]

FINDINGS: Normal oral and pharyngeal phases of swallowing, with no laryngeal
penetration or tracheobronchial aspiration. No significant barium
retention in the pharynx. No pharyngeal mass, stricture or
diverticulum. No significant cricopharyngeus muscle dysfunction.

Large hiatal hernia. Mild gastroesophageal reflux elicited to the
level of the midthoracic esophagus with water siphon test. Mild
esophageal dysmotility, characterized by intermittent mild weakening
of primary peristalsis in mid to lower thoracic esophagus. Mild
granularity of thoracic esophageal mucosa, suggesting mild reflux
esophagitis. Normal esophageal distensibility, with no evidence of
esophageal mass, stricture or ulcer. Barium tablet traversed the
esophagus into the stomach without delay.
IMPRESSION: 1. Large hiatal hernia.  Mild gastroesophageal reflux elicited.
2. Mild esophageal dysmotility, characteristic of chronic reflux
related dysmotility.
3. Suggestion of mild reflux esophagitis. No evidence of esophageal
mass or stricture.
